# Patient Record
Sex: Female | Born: 1980 | Race: Black or African American | Hispanic: No | Marital: Single | Smoking: Former smoker
Health system: Southern US, Community
[De-identification: ages and names within clinical notes are randomized; demographics above are authoritative.]

## PROBLEM LIST (undated history)

## (undated) DIAGNOSIS — G8929 Other chronic pain: Secondary | ICD-10-CM

## (undated) DIAGNOSIS — F329 Major depressive disorder, single episode, unspecified: Secondary | ICD-10-CM

## (undated) DIAGNOSIS — I10 Essential (primary) hypertension: Secondary | ICD-10-CM

## (undated) DIAGNOSIS — Z5189 Encounter for other specified aftercare: Secondary | ICD-10-CM

## (undated) DIAGNOSIS — E119 Type 2 diabetes mellitus without complications: Secondary | ICD-10-CM

## (undated) DIAGNOSIS — C801 Malignant (primary) neoplasm, unspecified: Secondary | ICD-10-CM

## (undated) DIAGNOSIS — I509 Heart failure, unspecified: Secondary | ICD-10-CM

## (undated) DIAGNOSIS — F419 Anxiety disorder, unspecified: Secondary | ICD-10-CM

## (undated) DIAGNOSIS — E785 Hyperlipidemia, unspecified: Secondary | ICD-10-CM

## (undated) HISTORY — PX: TRACHEOSTOMY: SUR1362

---

## 2015-08-03 ENCOUNTER — Emergency Department: Payer: Medicaid Other

## 2015-08-03 ENCOUNTER — Encounter: Payer: Self-pay | Admitting: Internal Medicine

## 2015-08-03 ENCOUNTER — Observation Stay
Admission: EM | Admit: 2015-08-03 | Discharge: 2015-08-04 | Disposition: A | Discharge status: Skilled Nursing Facility | Payer: Medicaid Other | Attending: Internal Medicine | Admitting: Internal Medicine

## 2015-08-03 DIAGNOSIS — E119 Type 2 diabetes mellitus without complications: Secondary | ICD-10-CM

## 2015-08-03 DIAGNOSIS — R778 Other specified abnormalities of plasma proteins: Secondary | ICD-10-CM | POA: Diagnosis present

## 2015-08-03 DIAGNOSIS — M4134 Thoracogenic scoliosis, thoracic region: Secondary | ICD-10-CM | POA: Diagnosis not present

## 2015-08-03 DIAGNOSIS — Z886 Allergy status to analgesic agent status: Secondary | ICD-10-CM | POA: Diagnosis not present

## 2015-08-03 DIAGNOSIS — I5022 Chronic systolic (congestive) heart failure: Secondary | ICD-10-CM | POA: Diagnosis not present

## 2015-08-03 DIAGNOSIS — R51 Headache: Secondary | ICD-10-CM | POA: Diagnosis not present

## 2015-08-03 DIAGNOSIS — Z930 Tracheostomy status: Secondary | ICD-10-CM | POA: Diagnosis not present

## 2015-08-03 DIAGNOSIS — Z79899 Other long term (current) drug therapy: Secondary | ICD-10-CM | POA: Insufficient documentation

## 2015-08-03 DIAGNOSIS — F419 Anxiety disorder, unspecified: Secondary | ICD-10-CM | POA: Insufficient documentation

## 2015-08-03 DIAGNOSIS — R0989 Other specified symptoms and signs involving the circulatory and respiratory systems: Secondary | ICD-10-CM | POA: Diagnosis not present

## 2015-08-03 DIAGNOSIS — N920 Excessive and frequent menstruation with regular cycle: Secondary | ICD-10-CM | POA: Diagnosis not present

## 2015-08-03 DIAGNOSIS — R5383 Other fatigue: Secondary | ICD-10-CM | POA: Diagnosis not present

## 2015-08-03 DIAGNOSIS — R0602 Shortness of breath: Secondary | ICD-10-CM | POA: Insufficient documentation

## 2015-08-03 DIAGNOSIS — Z6841 Body Mass Index (BMI) 40.0 and over, adult: Secondary | ICD-10-CM | POA: Insufficient documentation

## 2015-08-03 DIAGNOSIS — Z794 Long term (current) use of insulin: Secondary | ICD-10-CM | POA: Insufficient documentation

## 2015-08-03 DIAGNOSIS — E785 Hyperlipidemia, unspecified: Secondary | ICD-10-CM | POA: Diagnosis present

## 2015-08-03 DIAGNOSIS — D649 Anemia, unspecified: Secondary | ICD-10-CM | POA: Diagnosis not present

## 2015-08-03 DIAGNOSIS — R05 Cough: Secondary | ICD-10-CM | POA: Insufficient documentation

## 2015-08-03 DIAGNOSIS — E662 Morbid (severe) obesity with alveolar hypoventilation: Secondary | ICD-10-CM | POA: Insufficient documentation

## 2015-08-03 DIAGNOSIS — I10 Essential (primary) hypertension: Secondary | ICD-10-CM | POA: Diagnosis present

## 2015-08-03 DIAGNOSIS — R197 Diarrhea, unspecified: Secondary | ICD-10-CM | POA: Diagnosis present

## 2015-08-03 DIAGNOSIS — G8929 Other chronic pain: Secondary | ICD-10-CM | POA: Insufficient documentation

## 2015-08-03 DIAGNOSIS — F329 Major depressive disorder, single episode, unspecified: Secondary | ICD-10-CM | POA: Diagnosis not present

## 2015-08-03 DIAGNOSIS — R7989 Other specified abnormal findings of blood chemistry: Secondary | ICD-10-CM | POA: Diagnosis present

## 2015-08-03 HISTORY — DX: Other chronic pain: G89.29

## 2015-08-03 HISTORY — DX: Essential (primary) hypertension: I10

## 2015-08-03 HISTORY — DX: Anxiety disorder, unspecified: F41.9

## 2015-08-03 HISTORY — DX: Type 2 diabetes mellitus without complications: E11.9

## 2015-08-03 HISTORY — DX: Heart failure, unspecified: I50.9

## 2015-08-03 HISTORY — DX: Major depressive disorder, single episode, unspecified: F32.9

## 2015-08-03 HISTORY — DX: Hyperlipidemia, unspecified: E78.5

## 2015-08-03 LAB — GLUCOSE, CAPILLARY: GLUCOSE-CAPILLARY: 103 mg/dL — AB (ref 65–99)

## 2015-08-03 LAB — CBC WITH DIFFERENTIAL/PLATELET
BASOS PCT: 1 %
Basophils Absolute: 0.1 10*3/uL (ref 0–0.1)
Eosinophils Absolute: 0.4 10*3/uL (ref 0–0.7)
Eosinophils Relative: 3 %
HEMATOCRIT: 25.7 % — AB (ref 35.0–47.0)
HEMOGLOBIN: 7.9 g/dL — AB (ref 12.0–16.0)
LYMPHS ABS: 2.3 10*3/uL (ref 1.0–3.6)
Lymphocytes Relative: 15 %
MCH: 26.5 pg (ref 26.0–34.0)
MCHC: 30.9 g/dL — AB (ref 32.0–36.0)
MCV: 85.7 fL (ref 80.0–100.0)
MONOS PCT: 6 %
Monocytes Absolute: 1 10*3/uL — ABNORMAL HIGH (ref 0.2–0.9)
NEUTROS ABS: 11.5 10*3/uL — AB (ref 1.4–6.5)
NEUTROS PCT: 75 %
Platelets: 410 10*3/uL (ref 150–440)
RBC: 2.99 MIL/uL — AB (ref 3.80–5.20)
RDW: 18.6 % — ABNORMAL HIGH (ref 11.5–14.5)
WBC: 15.4 10*3/uL — AB (ref 3.6–11.0)

## 2015-08-03 LAB — URINALYSIS COMPLETE WITH MICROSCOPIC (ARMC ONLY)
Bacteria, UA: NONE SEEN
Bilirubin Urine: NEGATIVE
GLUCOSE, UA: NEGATIVE mg/dL
Hgb urine dipstick: NEGATIVE
KETONES UR: NEGATIVE mg/dL
Leukocytes, UA: NEGATIVE
NITRITE: NEGATIVE
Protein, ur: NEGATIVE mg/dL
RBC / HPF: NONE SEEN RBC/hpf (ref 0–5)
SPECIFIC GRAVITY, URINE: 1.009 (ref 1.005–1.030)
pH: 5 (ref 5.0–8.0)

## 2015-08-03 LAB — CBC
HEMATOCRIT: 26.2 % — AB (ref 35.0–47.0)
HEMOGLOBIN: 8.1 g/dL — AB (ref 12.0–16.0)
MCH: 25.9 pg — ABNORMAL LOW (ref 26.0–34.0)
MCHC: 30.8 g/dL — ABNORMAL LOW (ref 32.0–36.0)
MCV: 84.1 fL (ref 80.0–100.0)
Platelets: 469 10*3/uL — ABNORMAL HIGH (ref 150–440)
RBC: 3.11 MIL/uL — ABNORMAL LOW (ref 3.80–5.20)
RDW: 18.3 % — ABNORMAL HIGH (ref 11.5–14.5)
WBC: 14.9 10*3/uL — AB (ref 3.6–11.0)

## 2015-08-03 LAB — BASIC METABOLIC PANEL
ANION GAP: 5 (ref 5–15)
BUN: 19 mg/dL (ref 6–20)
CO2: 32 mmol/L (ref 22–32)
Calcium: 9.3 mg/dL (ref 8.9–10.3)
Chloride: 101 mmol/L (ref 101–111)
Creatinine, Ser: 0.93 mg/dL (ref 0.44–1.00)
GFR calc Af Amer: >60 mL/min (ref >60)
Glucose, Bld: 104 mg/dL — ABNORMAL HIGH (ref 65–99)
POTASSIUM: 4.1 mmol/L (ref 3.5–5.1)
Sodium: 138 mmol/L (ref 135–145)

## 2015-08-03 LAB — TROPONIN I
TROPONIN I: 0.04 ng/mL — AB (ref <0.031)
Troponin I: 0.04 ng/mL — ABNORMAL HIGH (ref <0.031)

## 2015-08-03 LAB — POCT RAPID STREP A: STREPTOCOCCUS, GROUP A SCREEN (DIRECT): NEGATIVE

## 2015-08-03 MED ORDER — HYDROMORPHONE HCL 1 MG/ML IJ SOLN
INTRAMUSCULAR | Status: AC
  Administered 2015-08-03: 1 mg via INTRAVENOUS
  Filled 2015-08-03: qty 1

## 2015-08-03 MED ORDER — SODIUM CHLORIDE 0.9 % IV SOLN: 10.0000 mL/h | Freq: Once | INTRAVENOUS | Status: AC

## 2015-08-03 MED ORDER — HYDROMORPHONE HCL 1 MG/ML IJ SOLN
1.0000 mg | Freq: Once | INTRAMUSCULAR | Status: AC
  Administered 2015-08-03: 1 mg via INTRAVENOUS

## 2015-08-03 MED ORDER — ACETAMINOPHEN 325 MG PO TABS
650.0000 mg | ORAL_TABLET | Freq: Once | ORAL | Status: AC
  Administered 2015-08-03: 650 mg via ORAL
  Filled 2015-08-03: qty 2

## 2015-08-03 MED ORDER — MEDROXYPROGESTERONE ACETATE 10 MG PO TABS
10.0000 mg | ORAL_TABLET | Freq: Once | ORAL | Status: DC
  Filled 2015-08-03: qty 1

## 2015-08-03 MED ORDER — FUROSEMIDE 10 MG/ML IJ SOLN
20.0000 mg | Freq: Once | INTRAMUSCULAR | Status: AC
  Administered 2015-08-03: 20 mg via INTRAVENOUS
  Filled 2015-08-03: qty 4

## 2015-08-03 NOTE — ED Provider Notes (Signed)
[facility] Emergency Department Provider Note  ____________________________________________  Time seen: Approximately 315 PM  I have reviewed the triage vital signs and the nursing notes.   HISTORY  Chief Complaint Fatigue and Diarrhea    HPI Sherry Ramsey is a 35 y.o. female with morbid obesity, CHF and respiratory failure who is presenting to the emergency department today with weakness, diarrhea and nausea over the past week. She says she is just changed nursing homes 2 weeks ago and has one week ago started to develop these symptoms. She does not report any blood in the stool. Denies any recent antibiotics. Says that she has had some recent burning with urination but no frequency. She is also reporting a metallic taste in her mouth as well as a diffuse headache which is worse frontally. She says that she has chronic headaches which often a 10 out of 10.  She says this headache is similar to previous. She says that she has had about 6 episodes of diarrhea both today and yesterday. She says that she was just taken off senna yesterday because of her diarrhea. She is also reporting a cough which is intermittently productive and chronic. Denies any runny nose.   No past medical history on file.  There are no active problems to display for this patient.   No past surgical history on file.  Current Outpatient Rx  Name  Route  Sig  Dispense  Refill  . atorvastatin (LIPITOR) 10 MG tablet   Oral   Take 10 mg by mouth at bedtime.         . butalbital-acetaminophen-caffeine (FIORICET, ESGIC) 50-325-40 MG tablet   Oral   Take 1 tablet by mouth every 4 (four) hours as needed for headache.         . carvedilol (COREG) 6.25 MG tablet   Oral   Take 6.25 mg by mouth 2 (two) times daily with a meal.         . clonazePAM (KLONOPIN) 0.5 MG tablet   Oral   Take 1 mg by mouth at bedtime.         . diphenhydrAMINE (BENADRYL) 25 MG tablet   Oral   Take 25 mg by  mouth every 6 (six) hours as needed for itching.         . furosemide (LASIX) 40 MG tablet   Oral   Take 40 mg by mouth.         Marland Kitchen guaiFENesin (MUCINEX) 600 MG 12 hr tablet   Oral   Take 600 mg by mouth 2 (two) times daily as needed for cough.         Marland Kitchen HYDROmorphone (DILAUDID) 2 MG tablet   Oral   Take 2 mg by mouth every 4 (four) hours as needed for severe pain.         Marland Kitchen lisinopril (PRINIVIL,ZESTRIL) 5 MG tablet   Oral   Take 5 mg by mouth daily.         . medroxyPROGESTERone (PROVERA) 10 MG tablet   Oral   Take 10 mg by mouth daily.         . Melatonin 5 MG TABS   Oral   Take 10 mg by mouth at bedtime.         . Multiple Vitamin (MULTIVITAMIN WITH MINERALS) TABS tablet   Oral   Take 1 tablet by mouth daily.         Marland Kitchen senna (SENOKOT) 8.6 MG tablet   Oral  Take 2 tablets by mouth 2 (two) times daily.         . sertraline (ZOLOFT) 50 MG tablet   Oral   Take 50 mg by mouth daily.         Marland Kitchen topiramate (TOPAMAX) 25 MG tablet   Oral   Take 25 mg by mouth 2 (two) times daily.         . traZODone (DESYREL) 50 MG tablet   Oral   Take 200 mg by mouth at bedtime.           Allergies Ibuprofen  No family history on file.  Social History Social History  Substance Use Topics  . Smoking status: Not on file  . Smokeless tobacco: Not on file  . Alcohol Use: Not on file    Review of Systems Constitutional: No fever/chills Eyes: No visual changes. ENT: Admits to mild sore throat Cardiovascular: Denies chest pain. Respiratory: As above Gastrointestinal: No abdominal pain.   no vomiting.  No constipation. Genitourinary: Negative for dysuria. Musculoskeletal: Chronic low back pain Skin: Negative for rash. Neurological: Negative for focal weakness or numbness.  10-point ROS otherwise negative.  ____________________________________________   PHYSICAL EXAM:  VITAL SIGNS: ED Triage Vitals  Enc Vitals Group     BP 08/03/15 1450  144/83 mmHg     Pulse Rate 08/03/15 1450 96     Resp 08/03/15 1450 24     Temp 08/03/15 1450 98.7 F (37.1 C)     Temp Source 08/03/15 1450 Oral     SpO2 08/03/15 1450 97 %     Weight 08/03/15 1450 550 lb (249.478 kg)     Height --      Head Cir --      Peak Flow --      Pain Score 08/03/15 1450 10     Pain Loc --      Pain Edu? --      Excl. in [place]? --     Constitutional: Alert and oriented. Well appearing and in no acute distress. Morbidly obese. Eyes: Conjunctivae are normal. PERRL. EOMI. Head: Atraumatic. Mild tenderness to palpation over the frontal and maxillary sinuses. Nose: No congestion/rhinnorhea. Mouth/Throat: Mucous membranes are moist.  Mild erythema to the posterior pharynx. Mild tonsillar swelling without exudate. Neck: Trach in place.  Cardiovascular: Normal rate, regular rhythm. Grossly normal heart sounds.   Respiratory: Normal respiratory effort.  No retractions. Lungs CTAB. Gastrointestinal: Soft and nontender. No distention. No abdominal bruits. No CVA tenderness. Musculoskeletal: Bilateral lower extremity swelling with moderate edema. The patient says that this is chronic and unchanged. She also has pain in her left lower extremity which is also chronic. Neurologic:  Normal speech and language. No gross focal neurologic deficits are appreciated.  Skin:  Skin is warm, dry and intact. No rash noted. Psychiatric: Mood and affect are normal. Speech and behavior are normal.  ____________________________________________   LABS (all labs ordered are listed, but only abnormal results are displayed)  Labs Reviewed  BASIC METABOLIC PANEL - Abnormal; Notable for the following:    Glucose, Bld 104 (*)    All other components within normal limits  CBC - Abnormal; Notable for the following:    WBC 14.9 (*)    RBC 3.11 (*)    Hemoglobin 8.1 (*)    HCT 26.2 (*)    MCH 25.9 (*)    MCHC 30.8 (*)    RDW 18.3 (*)    Platelets 469 (*)    All  other components within  normal limits  URINALYSIS COMPLETEWITH MICROSCOPIC ([facility] ONLY) - Abnormal; Notable for the following:    Color, Urine YELLOW (*)    APPearance CLEAR (*)    Squamous Epithelial / LPF 0-5 (*)    All other components within normal limits  GLUCOSE, CAPILLARY - Abnormal; Notable for the following:    Glucose-Capillary 103 (*)    All other components within normal limits  TROPONIN I - Abnormal; Notable for the following:    Troponin I 0.04 (*)    All other components within normal limits  CBC WITH DIFFERENTIAL/PLATELET - Abnormal; Notable for the following:    WBC 15.4 (*)    RBC 2.99 (*)    Hemoglobin 7.9 (*)    HCT 25.7 (*)    MCHC 30.9 (*)    RDW 18.6 (*)    Neutro Abs 11.5 (*)    Monocytes Absolute 1.0 (*)    All other components within normal limits  TROPONIN I - Abnormal; Notable for the following:    Troponin I 0.04 (*)    All other components within normal limits  CBG MONITORING, ED  POCT RAPID STREP A  PREPARE RBC (CROSSMATCH)   ____________________________________________  EKG  ED ECG REPORT I, Doran Stabler, the attending physician, personally viewed and interpreted this ECG.   Date: 08/03/2015  EKG Time: 1452  Rate: 91  Rhythm: normal sinus rhythm  Axis: Normal  Intervals:none  ST&T Change: No ST segment elevation or depression. No abnormal T-wave inversion.  ____________________________________________  RADIOLOGY  IMPRESSION: 1. Cardiac enlargement and pulmonary vascular congestion.   Electronically Signed By: Kerby Moors M.D. On: 08/03/2015 16:11  ____________________________________________   PROCEDURES    ____________________________________________   INITIAL IMPRESSION / ASSESSMENT AND PLAN / ED COURSE  Pertinent labs & imaging results that were available during my care of the patient were reviewed by me and considered in my medical decision making (see chart for details).  ----------------------------------------- 10:31  PM on 08/03/2015 -----------------------------------------  Discussed patient's previous lab results with her nurse at her current skilled nursing facility and she had a hemoglobin of 10 on 07/19/2015. The patient has had significant bleeding vaginally since then and was recently seen by OB/GYN was placed on medroxyprogesterone 10 mg daily. I discussed case with Dr. Leonides Schanz including the drop in her hemoglobin and she recommended that the medroxyprogesterone be increased to twice a day, 10 mg. I will be transfusing the patient one unit of blood because of her he will been dry. She did have a small amount of blood on her sheets here but no mass amount of vaginal bleeding. I discussed the case with the patient as well as her lab results and the need for admission. Signed out to Dr. Jannifer Franklin. Patient has not had any further episodes of diarrhea. Unlikely to be C. difficile. ____________________________________________   FINAL CLINICAL IMPRESSION(S) / ED DIAGNOSES  Symptomatic anemia. Vaginal bleeding. Diarrhea.     Orbie Pyo, MD 08/03/15 [phone]

## 2015-08-03 NOTE — ED Notes (Signed)
Pt arrives to ER via [facility] from [facility]. Pt states that she went to OBGYN earlier in the week; has not felt well since. C/o fatigue and diarrhea. Pt recently moved to [facility]. Pt tearful, c/o headache. Pt arrives on 3L [place].

## 2015-08-03 NOTE — H&P (Signed)
[place] [facility] [place] NAME: Sherry Ramsey    MR#:  [id number]  DATE OF BIRTH:  Jun 29, 1980  DATE OF ADMISSION:  08/03/2015  PRIMARY CARE PHYSICIAN: No primary care provider on file.   REQUESTING/REFERRING PHYSICIAN: Clearnce Hasten, MD  CHIEF COMPLAINT:   Chief Complaint  Patient presents with  . Fatigue  . Diarrhea    HISTORY OF PRESENT ILLNESS:  Sherry Ramsey  is a 35 y.o. female who presents with progressive fatigue and headache. Patient has many comorbid medical problems, and recently has been having significant vaginal bleeding. She was seen by obstetrics and was put on oral contraceptive to help with her menorrhagia. She states that this did help with her menorrhagia, which is currently resolved. However, she's had progressive fatigue and headache recently. On evaluation in the ED today her hemoglobin was found to be 7.9. She has significant history of heart failure, and had some vascular congestion on her chest x-ray today. She was ordered a unit of blood for transfusion for symptom medical anemia, and hospitalists were called for admission.  PAST MEDICAL HISTORY:   Past Medical History  Diagnosis Date  . Diabetes ([place])   . HTN (hypertension)   . HLD (hyperlipidemia)   . Chronic pain   . CHF (congestive heart failure) ([place])   . MDD (major depressive disorder) ([place])   . Anxiety     PAST SURGICAL HISTORY:   Past Surgical History  Procedure Laterality Date  . Tracheostomy      SOCIAL HISTORY:   Social History  Substance Use Topics  . Smoking status: Never Smoker   . Smokeless tobacco: Not on file  . Alcohol Use: No    FAMILY HISTORY:   Family History  Problem Relation Age of Onset  . Diabetes Neg Hx   . Heart failure Neg Hx     DRUG ALLERGIES:   Allergies  Allergen Reactions  . Ibuprofen Other (See Comments)    Reaction: unknown    MEDICATIONS AT HOME:   Prior to Admission medications   Medication Sig Start  Date End Date Taking? Authorizing Provider  atorvastatin (LIPITOR) 10 MG tablet Take 10 mg by mouth at bedtime.   Yes Historical Provider, MD  butalbital-acetaminophen-caffeine (FIORICET, ESGIC) 50-325-40 MG tablet Take 1 tablet by mouth every 4 (four) hours as needed for headache.   Yes Historical Provider, MD  carvedilol (COREG) 6.25 MG tablet Take 6.25 mg by mouth 2 (two) times daily with a meal.   Yes Historical Provider, MD  clonazePAM (KLONOPIN) 0.5 MG tablet Take 1 mg by mouth at bedtime.   Yes Historical Provider, MD  diphenhydrAMINE (BENADRYL) 25 MG tablet Take 25 mg by mouth every 6 (six) hours as needed for itching.   Yes Historical Provider, MD  furosemide (LASIX) 40 MG tablet Take 40 mg by mouth.   Yes Historical Provider, MD  guaiFENesin (MUCINEX) 600 MG 12 hr tablet Take 600 mg by mouth 2 (two) times daily as needed for cough.   Yes Historical Provider, MD  HYDROmorphone (DILAUDID) 2 MG tablet Take 2 mg by mouth every 4 (four) hours as needed for severe pain. 07/25/15 08/15/15 Yes Historical Provider, MD  lisinopril (PRINIVIL,ZESTRIL) 5 MG tablet Take 5 mg by mouth daily.   Yes Historical Provider, MD  medroxyPROGESTERone (PROVERA) 10 MG tablet Take 10 mg by mouth daily.   Yes Historical Provider, MD  Melatonin 5 MG TABS Take 10 mg by mouth at bedtime.  Yes Historical Provider, MD  Multiple Vitamin (MULTIVITAMIN WITH MINERALS) TABS tablet Take 1 tablet by mouth daily.   Yes Historical Provider, MD  senna (SENOKOT) 8.6 MG tablet Take 2 tablets by mouth 2 (two) times daily.   Yes Historical Provider, MD  sertraline (ZOLOFT) 50 MG tablet Take 50 mg by mouth daily. 07/29/15 08/04/15 Yes Historical Provider, MD  topiramate (TOPAMAX) 25 MG tablet Take 25 mg by mouth 2 (two) times daily.   Yes Historical Provider, MD  traZODone (DESYREL) 50 MG tablet Take 200 mg by mouth at bedtime.   Yes Historical Provider, MD    REVIEW OF SYSTEMS:  Review of Systems  Constitutional: Positive for  malaise/fatigue. Negative for fever, chills and weight loss.  HENT: Negative for ear pain, hearing loss and tinnitus.   Eyes: Negative for blurred vision, double vision, pain and redness.  Respiratory: Negative for cough, hemoptysis and shortness of breath.   Cardiovascular: Negative for chest pain, palpitations, orthopnea and leg swelling.  Gastrointestinal: Negative for nausea, vomiting, abdominal pain, diarrhea and constipation.  Genitourinary: Negative for dysuria, frequency and hematuria.  Musculoskeletal: Negative for back pain, joint pain and neck pain.  Skin:       No acne, rash, or lesions  Neurological: Positive for headaches. Negative for dizziness, tremors, focal weakness and weakness.  Endo/Heme/Allergies: Negative for polydipsia. Does not bruise/bleed easily.  Psychiatric/Behavioral: Negative for depression. The patient is not nervous/anxious and does not have insomnia.      VITAL SIGNS:   Filed Vitals:   08/03/15 1645 08/03/15 1700 08/03/15 1730 08/03/15 1934  BP:  106/57 128/74 129/85  Pulse: 94  97 90  Temp:      TempSrc:      Resp: 18 20 25 21   Weight:      SpO2: 100%  99% 100%   Wt Readings from Last 3 Encounters:  08/03/15 249.478 kg (550 lb)    PHYSICAL EXAMINATION:  Physical Exam  Vitals reviewed. Constitutional: She is oriented to person, place, and time. She appears well-developed and well-nourished. No distress.  HENT:  Head: Normocephalic and atraumatic.  Mouth/Throat: Oropharynx is clear and moist.  Eyes: Conjunctivae and EOM are normal. Pupils are equal, round, and reactive to light. No scleral icterus.  Neck: Normal range of motion. Neck supple. No JVD present. No thyromegaly present.  Cardiovascular: Normal rate, regular rhythm and intact distal pulses.  Exam reveals no gallop and no friction rub.   No murmur heard. Respiratory: Effort normal and breath sounds normal. No respiratory distress. She has no wheezes. She has no rales.  GI: Soft.  Bowel sounds are normal. She exhibits no distension. There is no tenderness.  Morbidly obese  Musculoskeletal: Normal range of motion. She exhibits no edema.  No arthritis, no gout  Lymphadenopathy:    She has no cervical adenopathy.  Neurological: She is alert and oriented to person, place, and time. No cranial nerve deficit.  No dysarthria, no aphasia  Skin: Skin is warm and dry. No rash noted. No erythema.  Psychiatric: She has a normal mood and affect. Her behavior is normal. Judgment and thought content normal.    LABORATORY PANEL:   CBC  Recent Labs Lab 08/03/15 2024  WBC 15.4*  HGB 7.9*  HCT 25.7*  PLT 410   ------------------------------------------------------------------------------------------------------------------  Chemistries   Recent Labs Lab 08/03/15 1628  NA 138  K 4.1  CL 101  CO2 32  GLUCOSE 104*  BUN 19  CREATININE 0.93  CALCIUM 9.3   ------------------------------------------------------------------------------------------------------------------  Cardiac Enzymes  Recent Labs Lab 08/03/15 2024  TROPONINI 0.04*   ------------------------------------------------------------------------------------------------------------------  RADIOLOGY:  Dg Chest 2 View  08/03/2015  CLINICAL DATA:  Shortness of breath. EXAM: CHEST  2 VIEW COMPARISON:  None. FINDINGS: The heart size is moderately enlarged. There is a tracheostomy tube with tip above the carina. Pulmonary vascular congestion is noted. No airspace consolidation. No pleural effusion. Scoliosis deformity involves the thoracic spine. IMPRESSION: 1. Cardiac enlargement and pulmonary vascular congestion. Electronically Signed   By: Kerby Moors M.D.   On: 08/03/2015 16:11    EKG:   Orders placed or performed during the hospital encounter of 08/03/15  . ED EKG  . ED EKG    IMPRESSION AND PLAN:  Principal Problem:   Symptomatic anemia - one unit of packed red blood cells ordered to be  transfused by ED physician, we will admit her for observation and expected improvement. Recheck H&H posttransfusion. Active Problems:   Elevated troponin - very mildly elevated, very likely either her baseline or very mild demand ischemia. However, we will trend serially tonight. No complaint of chest pain.   Type 2 diabetes mellitus (HCC) - sliding scale insulin with corresponding glucose checks and carb modified diet   HTN (hypertension) - continue home meds, currently stable   Chronic systolic CHF (congestive heart failure) ([place]) - she is given a dose of IV Lasix per transfusion, we'll continue her home dose diuretic and monitor closely, can use further IV diuretics if necessary.   HLD (hyperlipidemia) - continue home meds  All the records are reviewed and case discussed with ED provider. Management plans discussed with the patient and/or family.  DVT PROPHYLAXIS: SubQ lovenox  GI PROPHYLAXIS: None  ADMISSION STATUS: Observation  CODE STATUS: Full Code Status History    This patient does not have a recorded code status. Please follow your organizational policy for patients in this situation.      TOTAL TIME TAKING CARE OF THIS PATIENT: 40 minutes.    Hye Trawick Ford 08/03/2015, 10:55 PM  [facility]  Office  [phone]  CC: Primary care physician; No primary care provider on file.

## 2015-08-04 ENCOUNTER — Observation Stay
Admit: 2015-08-04 | Discharge: 2015-08-04 | Disposition: A | Discharge status: Home / Self Care | Payer: Medicaid Other | Attending: Internal Medicine | Admitting: Internal Medicine

## 2015-08-04 LAB — GLUCOSE, CAPILLARY
GLUCOSE-CAPILLARY: 107 mg/dL — AB (ref 65–99)
Glucose-Capillary: 104 mg/dL — ABNORMAL HIGH (ref 65–99)
Glucose-Capillary: 113 mg/dL — ABNORMAL HIGH (ref 65–99)
Glucose-Capillary: 128 mg/dL — ABNORMAL HIGH (ref 65–99)

## 2015-08-04 LAB — BASIC METABOLIC PANEL
Anion gap: 4 — ABNORMAL LOW (ref 5–15)
BUN: 18 mg/dL (ref 6–20)
CO2: 33 mmol/L — ABNORMAL HIGH (ref 22–32)
CREATININE: 0.89 mg/dL (ref 0.44–1.00)
Calcium: 8.7 mg/dL — ABNORMAL LOW (ref 8.9–10.3)
Chloride: 102 mmol/L (ref 101–111)
GFR calc Af Amer: >60 mL/min (ref >60)
GLUCOSE: 123 mg/dL — AB (ref 65–99)
POTASSIUM: 3.8 mmol/L (ref 3.5–5.1)
SODIUM: 139 mmol/L (ref 135–145)

## 2015-08-04 LAB — PREPARE RBC (CROSSMATCH)

## 2015-08-04 LAB — CBC
HEMATOCRIT: 27.7 % — AB (ref 35.0–47.0)
Hemoglobin: 8.7 g/dL — ABNORMAL LOW (ref 12.0–16.0)
MCH: 26.5 pg (ref 26.0–34.0)
MCHC: 31.2 g/dL — AB (ref 32.0–36.0)
MCV: 84.9 fL (ref 80.0–100.0)
PLATELETS: 450 10*3/uL — AB (ref 150–440)
RBC: 3.26 MIL/uL — ABNORMAL LOW (ref 3.80–5.20)
RDW: 18.5 % — AB (ref 11.5–14.5)
WBC: 14.2 10*3/uL — AB (ref 3.6–11.0)

## 2015-08-04 LAB — TROPONIN I
TROPONIN I: 0.1 ng/mL — AB (ref <0.031)
TROPONIN I: 0.04 ng/mL — AB (ref <0.031)
TROPONIN I: 0.05 ng/mL — AB (ref <0.031)

## 2015-08-04 LAB — ECHOCARDIOGRAM COMPLETE: Weight: 7289.6 oz

## 2015-08-04 LAB — BRAIN NATRIURETIC PEPTIDE: B NATRIURETIC PEPTIDE 5: 230 pg/mL — AB (ref 0.0–100.0)

## 2015-08-04 LAB — MRSA PCR SCREENING: MRSA by PCR: POSITIVE — AB

## 2015-08-04 LAB — ABO/RH: ABO/RH(D): O POS

## 2015-08-04 LAB — HEMOGLOBIN A1C: HEMOGLOBIN A1C: 5.6 % (ref 4.0–6.0)

## 2015-08-04 MED ORDER — LISINOPRIL 5 MG PO TABS
5.0000 mg | ORAL_TABLET | Freq: Every day | ORAL | Status: DC
  Administered 2015-08-04: 5 mg via ORAL
  Filled 2015-08-04: qty 1

## 2015-08-04 MED ORDER — ONDANSETRON HCL 4 MG PO TABS: 4.0000 mg | ORAL_TABLET | Freq: Four times a day (QID) | ORAL | Status: DC | PRN

## 2015-08-04 MED ORDER — CLONAZEPAM 1 MG PO TABS: 1.0000 mg | ORAL_TABLET | Freq: Every day | ORAL | Status: DC

## 2015-08-04 MED ORDER — ACETAMINOPHEN 650 MG RE SUPP: 650.0000 mg | Freq: Four times a day (QID) | RECTAL | Status: DC | PRN

## 2015-08-04 MED ORDER — ONDANSETRON HCL 4 MG/2ML IJ SOLN: 4.0000 mg | Freq: Four times a day (QID) | INTRAMUSCULAR | Status: DC | PRN

## 2015-08-04 MED ORDER — ATORVASTATIN CALCIUM 10 MG PO TABS
10.0000 mg | ORAL_TABLET | Freq: Every day | ORAL | Status: DC
  Filled 2015-08-04: qty 1

## 2015-08-04 MED ORDER — ENOXAPARIN SODIUM 40 MG/0.4ML ~~LOC~~ SOLN: 40.0000 mg | Freq: Two times a day (BID) | SUBCUTANEOUS | Status: DC

## 2015-08-04 MED ORDER — INSULIN ASPART 100 UNIT/ML ~~LOC~~ SOLN: 0.0000 [IU] | Freq: Every day | SUBCUTANEOUS | Status: DC

## 2015-08-04 MED ORDER — LORAZEPAM 2 MG/ML IJ SOLN
INTRAMUSCULAR | Status: AC
  Administered 2015-08-04: 1 mg
  Filled 2015-08-04: qty 1

## 2015-08-04 MED ORDER — CARVEDILOL 6.25 MG PO TABS
6.2500 mg | ORAL_TABLET | Freq: Two times a day (BID) | ORAL | Status: DC
  Administered 2015-08-04: 6.25 mg via ORAL
  Filled 2015-08-04: qty 1

## 2015-08-04 MED ORDER — SODIUM CHLORIDE 0.9 % IV SOLN
Freq: Once | INTRAVENOUS | Status: AC
  Administered 2015-08-04: 02:00:00 via INTRAVENOUS

## 2015-08-04 MED ORDER — TOPIRAMATE 25 MG PO TABS
25.0000 mg | ORAL_TABLET | Freq: Two times a day (BID) | ORAL | Status: DC
  Administered 2015-08-04: 25 mg via ORAL
  Filled 2015-08-04 (×3): qty 1

## 2015-08-04 MED ORDER — ACETAMINOPHEN 325 MG PO TABS
650.0000 mg | ORAL_TABLET | Freq: Four times a day (QID) | ORAL | Status: DC | PRN
  Administered 2015-08-04: 650 mg via ORAL
  Filled 2015-08-04: qty 2

## 2015-08-04 MED ORDER — MUPIROCIN 2 % EX OINT
1.0000 "application " | TOPICAL_OINTMENT | Freq: Two times a day (BID) | CUTANEOUS | Status: DC
  Filled 2015-08-04: qty 22

## 2015-08-04 MED ORDER — SODIUM CHLORIDE 0.9% FLUSH
3.0000 mL | Freq: Two times a day (BID) | INTRAVENOUS | Status: DC
  Administered 2015-08-04: 3 mL via INTRAVENOUS

## 2015-08-04 MED ORDER — LORAZEPAM 2 MG/ML IJ SOLN: 1.0000 mg | Freq: Once | INTRAMUSCULAR | Status: DC

## 2015-08-04 MED ORDER — TRAZODONE HCL 50 MG PO TABS
50.0000 mg | ORAL_TABLET | Freq: Every day | ORAL | Status: DC
  Filled 2015-08-04: qty 1

## 2015-08-04 MED ORDER — HYDROMORPHONE HCL 2 MG PO TABS
2.0000 mg | ORAL_TABLET | ORAL | Status: DC | PRN
  Administered 2015-08-04: 2 mg via ORAL
  Filled 2015-08-04 (×2): qty 1

## 2015-08-04 MED ORDER — FUROSEMIDE 40 MG PO TABS
40.0000 mg | ORAL_TABLET | Freq: Every day | ORAL | Status: DC
  Administered 2015-08-04: 40 mg via ORAL
  Filled 2015-08-04: qty 1

## 2015-08-04 MED ORDER — SERTRALINE HCL 50 MG PO TABS
50.0000 mg | ORAL_TABLET | Freq: Every day | ORAL | Status: DC
  Filled 2015-08-04: qty 1

## 2015-08-04 MED ORDER — CHLORHEXIDINE GLUCONATE CLOTH 2 % EX PADS: 6.0000 | MEDICATED_PAD | Freq: Every day | CUTANEOUS | Status: DC

## 2015-08-04 MED ORDER — INSULIN ASPART 100 UNIT/ML ~~LOC~~ SOLN: 0.0000 [IU] | Freq: Three times a day (TID) | SUBCUTANEOUS | Status: DC

## 2015-08-04 NOTE — Care Management Note (Addendum)
Case Management Note  Patient Details  Name: Bryson Grafe MRN: [id number] Date of Birth: 02-29-1981  Subjective/Objective:   CSW Mel Almond) updated that patient is to discharge back to [facility] today                 Action/Plan:   Expected Discharge Date:                  Expected Discharge Plan:     In-House Referral:     Discharge planning Services     Post Acute Care Choice:    Choice offered to:     DME Arranged:    DME Agency:     HH Arranged:    [place] Agency:     Status of Service:     Medicare Important Message Given:    Date Medicare IM Given:    Medicare IM give by:    Date Additional Medicare IM Given:    Additional Medicare Important Message give by:     If discussed at [place] of Stay Meetings, dates discussed:    Additional Comments:  Jolly Mango, RN 08/04/2015, 9:58 AM

## 2015-08-04 NOTE — NC FL2 (Signed)
[place] LEVEL OF CARE SCREENING TOOL     IDENTIFICATION  Patient Name: Sherry Ramsey Birthdate: 26-May-1980 Sex: female Admission Date (Current Location): 08/03/2015  [facility] and [place] Number:  Sherry Ramsey  ([id number] [facility]) Facility and Address:  [facility], [address], [place], [place] [address]      Provider Number: [id number]  Attending Physician Name and Address:  Demetrios Loll, MD  Relative Name and Phone Number:       Current Level of Care: Hospital Recommended Level of Care: [place] Prior Approval Number:    Date Approved/Denied:   PASRR Number:  ( [id number] )  Discharge Plan: SNF    Current Diagnoses: Patient Active Problem List   Diagnosis Date Noted  . Type 2 diabetes mellitus ([place]) 08/03/2015  . HTN (hypertension) 08/03/2015  . HLD (hyperlipidemia) 08/03/2015  . Chronic systolic CHF (congestive heart failure) ([place]) 08/03/2015  . Elevated troponin 08/03/2015  . Symptomatic anemia 08/03/2015    Orientation RESPIRATION BLADDER Height & Weight     Self, Time, Situation, Place  Tracheostomy, O2 (10 Liters Oxygen ) Continent Weight: (!) 455 lb 9.6 oz (206.659 kg) Height:  5\' 4"  (162.6 cm)  BEHAVIORAL SYMPTOMS/MOOD NEUROLOGICAL BOWEL NUTRITION STATUS   (none )  (none ) Continent Diet (Low Sodium. )  AMBULATORY STATUS COMMUNICATION OF NEEDS Skin   Extensive Assist Verbally Normal                       Personal Care Assistance Level of Assistance  Bathing, Feeding, Dressing Bathing Assistance: Maximum assistance Feeding assistance: Limited assistance Dressing Assistance: Maximum assistance     Functional Limitations Info  Sight, Hearing, Speech Sight Info: Adequate Hearing Info: Adequate Speech Info: Adequate    SPECIAL CARE FACTORS FREQUENCY                       Contractures      Additional Factors Info  Psychotropic, Insulin Sliding Scale       Insulin Sliding Scale Info:   (NovoLog Insulin Injections )       Current Medications (08/04/2015):  This is the current hospital active medication list Current Facility-Administered Medications  Medication Dose Route Frequency Provider Last Rate Last Dose  . acetaminophen (TYLENOL) tablet 650 mg  650 mg Oral Q6H PRN Lance Coon, MD       Or  . acetaminophen (TYLENOL) suppository 650 mg  650 mg Rectal Q6H PRN Lance Coon, MD      . atorvastatin (LIPITOR) tablet 10 mg  10 mg Oral QHS Lance Coon, MD   10 mg at 08/04/15 0100  . carvedilol (COREG) tablet 6.25 mg  6.25 mg Oral BID WC Lance Coon, MD   6.25 mg at 08/04/15 0906  . Chlorhexidine Gluconate Cloth 2 % PADS 6 each  6 each Topical Q0600 Demetrios Loll, MD   6 each at 08/04/15 0830  . clonazePAM (KLONOPIN) tablet 1 mg  1 mg Oral QHS Lance Coon, MD   1 mg at 08/04/15 0102  . enoxaparin (LOVENOX) injection 40 mg  40 mg Subcutaneous BID Lance Coon, MD   40 mg at 08/04/15 1000  . furosemide (LASIX) tablet 40 mg  40 mg Oral Daily Lance Coon, MD   40 mg at 08/04/15 0905  . HYDROmorphone (DILAUDID) tablet 2 mg  2 mg Oral Q4H PRN Lance Coon, MD      . insulin aspart (novoLOG) injection 0-5 Units  0-5  Units Subcutaneous QHS Lance Coon, MD   0 Units at 08/04/15 0100  . insulin aspart (novoLOG) injection 0-9 Units  0-9 Units Subcutaneous TID WC Lance Coon, MD   0 Units at 08/04/15 0800  . lisinopril (PRINIVIL,ZESTRIL) tablet 5 mg  5 mg Oral Daily Lance Coon, MD   5 mg at 08/04/15 0906  . LORazepam (ATIVAN) injection 1 mg  1 mg Intravenous Once Harrie Foreman, MD   1 mg at 08/04/15 0330  . medroxyPROGESTERone (PROVERA) tablet 10 mg  10 mg Oral Once Orbie Pyo, MD   10 mg at 08/03/15 2230  . mupirocin ointment (BACTROBAN) 2 % 1 application  1 application Nasal BID Demetrios Loll, MD   1 application at [id number]  . ondansetron (ZOFRAN) tablet 4 mg  4 mg Oral Q6H PRN Lance Coon, MD       Or  . ondansetron [facility]) injection 4 mg  4 mg Intravenous Q6H  PRN Lance Coon, MD      . sertraline (ZOLOFT) tablet 50 mg  50 mg Oral Daily Lance Coon, MD   50 mg at 08/04/15 0906  . sodium chloride flush (NS) 0.9 % injection 3 mL  3 mL Intravenous Q12H Lance Coon, MD   3 mL at 08/04/15 0909  . topiramate (TOPAMAX) tablet 25 mg  25 mg Oral BID Lance Coon, MD   25 mg at 08/04/15 0906  . traZODone (DESYREL) tablet 50 mg  50 mg Oral QHS Harrie Foreman, MD   50 mg at 08/04/15 0200     Discharge Medications: Please see discharge summary for a list of discharge medications.  Relevant Imaging Results:  Relevant Lab Results:   Additional Information  (SSN: [id number])  Loralyn Freshwater, LCSW

## 2015-08-04 NOTE — Significant Event (Signed)
Rapid Response Event Note  Overview: RR called for pt who pulled out her trach and refusing to let RT suction her.  Informed by RT that they tried to suction the pt, she became irritated and initially pulled out her inner cannula.  When RT re-inserted inner cannula and re-attempted to suction pt, pt became agitated and pulled out the whole trach.  RT was able to re-insert new trach and inner cannula from emergency bedside equipment.    Initial Focused Assessment:  Pt is alert and appears anxious in the bed.  Frank blood and frothy sections coming from trach.  On trach collar with o2 sats 98-99%.  Pt agitated and reaching for trach site.  Interventions:  Pt given ativan for anxiety, RT suctioned pt, and pt provided reassurance.   Event Summary: RR called at 0330; event ended at [place], Ester Hilley D

## 2015-08-04 NOTE — Consult Note (Signed)
Sherry Ramsey, Sherry Ramsey [id number] 11-08-1980 Demetrios Loll, MD  Reason for Consult: Evaluate trach site  HPI: The patient is a 35 year old Afro-American female who is morbidly obese and had a tracheostomy placed several weeks ago. She this scheduled be transferred to a ill nursing facility today. Last night pulled the trach tube out and a fresh one was placed back. Assessment straighter trach site to make sure this is okay.  Allergies:  Allergies  Allergen Reactions  . Ibuprofen Other (See Comments)    Reaction: unknown    ROS: Review of systems normal other than 12 systems except per HPI.  PMH:  Past Medical History  Diagnosis Date  . Diabetes ([place])   . HTN (hypertension)   . HLD (hyperlipidemia)   . Chronic pain   . CHF (congestive heart failure) ([place])   . MDD (major depressive disorder) ([place])   . Anxiety     FH:  Family History  Problem Relation Age of Onset  . Diabetes Neg Hx   . Heart failure Neg Hx     SH:  Social History   Social History  . Marital Status: Single    Spouse Name: N/A  . Number of Children: N/A  . Years of Education: N/A   Occupational History  . Not on file.   Social History Main Topics  . Smoking status: Never Smoker   . Smokeless tobacco: Not on file  . Alcohol Use: No  . Drug Use: No  . Sexual Activity: Not on file   Other Topics Concern  . Not on file   Social History Narrative    PSH:  Past Surgical History  Procedure Laterality Date  . Tracheostomy      Physical  Exam: Morbidly obese African [place] female. CN 2-12 grossly intact and symmetric.  Oral cavity, lips, gums, ororpharynx normal with no masses or lesions. Skin warm and dry. Nasal cavity without polyps or purulence. External nose and ears without masses or lesions. Neck supple with no masses or lesions, but extremely fat. No lymphadenopathy palpated. Thyroid normal with no masses. Trachea is midline in a good position in the neck. It is a #6 Shiley XLT proximal length tube. The  stoma site is clean.   A/P: Patient is morbidly obese needs to have the trach tube still sitting in the same position. This is the correct size for her and needs to remain in until she loses significant weight because of her obesity hypoventilation syndrome. The trach tube needs to be changed routinely about every 1-2 months to make sure that she does not get infection. The inner cannula should be changed daily. She is tolerating the speaking very well and can wear that all day long but may need to have that removed while she is sleeping.   Sherry Ramsey H 08/04/2015 1:03 PM

## 2015-08-04 NOTE — Discharge Instructions (Signed)
Heart healthy and ADA diet. °Activity as tolerated. °

## 2015-08-04 NOTE — Progress Notes (Signed)
EMS arrived and patient has now been transported. IV's and tele removed. Patient connected to ventimask on 3L, per respiratory's order.

## 2015-08-04 NOTE — Progress Notes (Signed)
*  PRELIMINARY RESULTS* Echocardiogram 2D Echocardiogram has been performed.  Sherry Ramsey 08/04/2015, 10:32 AM

## 2015-08-04 NOTE — Progress Notes (Signed)
EMS was called at 1445 and notified of transport. Informed EMS that patient is around 500lb and will need a larger stretcher. EMS arrived and had a stretcher for the weight requirement, but not one wide enough to fit the patient. EMS will be back with a different stretcher.

## 2015-08-04 NOTE — Clinical Social Work Note (Addendum)
Clinical Social Work Assessment  Patient Details  Name: Sherry Ramsey MRN: [id number] Date of Birth: 04/12/1981  Date of referral:  08/04/15               Reason for consult:  Facility Placement, Other (Comment Required) (From [place] )                Permission sought to share information with:  Chartered certified accountant granted to share information::  Yes, Verbal Permission Granted  Name::      Company secretary::   [place]   Relationship::   Long Term Care  Contact Information:     Housing/Transportation Living arrangements for the past 2 months:  [place] of Information:  Patient Patient Interpreter Needed:  None Criminal Activity/Legal Involvement Pertinent to Current Situation/Hospitalization:  No - Comment as needed Significant Relationships:  Siblings, Friend Lives with:  Facility Resident Do you feel safe going back to the place where you live?  Yes Need for family participation in patient care:  Yes (Comment)  Care giving concerns:  Patient is a long term care resident at [facility]. Patient has a Therapist, nutritional and is on 10 Liters of Oxygen.    Social Worker assessment / plan:  Holiday representative ([place]) received verbal consult from RN Case Freight forwarder that patient is from [facility] and is ready for D/C today. CSW contacted [facility] at [employer]. Per Helene Kelp patient is a long term care resident on [facility] and can return when stable. CSW met with patient alone at bedside. Patient was alert and oriented and was laying in the bed. Patient is agreeable to return to [facility] today and requested to be put on nasal cannula oxygen. CSW made RN aware of patient's request.   Patient is stable for D/C back to [facility] today. Per Helene Kelp patient can return. CSW sent D/C Summary, FL2 and D/C Packet to [place] via [place]. Patient is aware of above. CSW attempted to  call both contacts on patient's face sheet (friend Blanch Media and brother) however nobody answered and voicemails were left. Please reconsult if future social work needs arise. CSW signing off.   Blanch Media patient's friend and advocate called CSW back. CSW made Blanch Media aware of above and she is in agreement with plan.    Employment status:  Disabled (Comment on whether or not currently receiving Disability) Insurance information:  Medicaid [place] PT Recommendations:  Not assessed at this time Information / Referral to community resources:  [place] (Return to LTC at [facility] )  Patient/Family's Response to care:  Patient is agreeable for patient to return to [facility] today.   Patient/Family's Understanding of and Emotional Response to Diagnosis, Current Treatment, and Prognosis:  Patient was pleasant and thanked CSW for visit.   Emotional Assessment Appearance:  Appears stated age Attitude/Demeanor/Rapport:    Affect (typically observed):  Accepting, Adaptable, Pleasant Orientation:  Oriented to Self, Oriented to Place, Oriented to  Time, Oriented to Situation Alcohol / Substance use:  Not Applicable Psych involvement (Current and /or in the community):  No (Comment)  Discharge Needs  Concerns to be addressed:  Discharge Planning Concerns Readmission within the last 30 days:  No Current discharge risk:  None Barriers to Discharge:  No Barriers Identified   Loralyn Freshwater, LCSW 08/04/2015, 1:01 PM

## 2015-08-04 NOTE — Progress Notes (Signed)
Called by charge RN-Alisa to check to make sure proper trach supplies were at patient's bedside.  Arrived to find patient watch tv and eating a graham cracker.  Introduced myself and explained to patient that I was just checking to make sure all trach supplies was available at bedside. Patient then turned the light on and just started stating she could not breath. Patient had pmv on and was able to verbalize clearly.  I placed patient on bedside monitor. Saturation on ATC 100% HR 125. Patient very anxious.  RN at bedside. Attempted to lavage suction this patient however she would not let me go down with the catheter. She did not want any saline applied in trach.  She pulled the inner cannula out and would not allow me to put back in.  Other RTs arrived to assist with scenario. She would not allow other RTs to suction her also but insisted that she could not breath. Patient then pulls trach completely out of airway.  RT-Steve was able to put in a new trach #6 xlt cuffed deflated trach.  She continued to say she could not breath. She didn't want trach ties around her neck. She didn't want to be suctioned. Saturation remained good throughout scenario as well as heart rate.  RN did call a rapid response during this event.  Once trach was secured patient continued to give RT hard time about suctioning. Dr Marcille Blanco responded to event and explained to her why deep suctioning may be necessary even if the feeling is unpleasant. RT-Steve was able to suction patient without difficulty.  Vitals good. Trach collar in place. Emergency supplies remain in room

## 2015-08-04 NOTE — Discharge Summary (Signed)
[place] [facility] [place] NAME: Sherry Ramsey    MR#:  [id number]  DATE OF BIRTH:  March 13, 1981  DATE OF ADMISSION:  08/03/2015 ADMITTING PHYSICIAN: Lance Coon, MD  DATE OF DISCHARGE: 08/04/2015 PRIMARY CARE PHYSICIAN: No primary care provider on file.    ADMISSION DIAGNOSIS:  Symptomatic anemia [D64.9] Diarrhea, unspecified type [R19.7]   DISCHARGE DIAGNOSIS:  Symptomatic anemia  SECONDARY DIAGNOSIS:   Past Medical History  Diagnosis Date  . Diabetes ([place])   . HTN (hypertension)   . HLD (hyperlipidemia)   . Chronic pain   . CHF (congestive heart failure) ([place])   . MDD (major depressive disorder) ([place])   . Anxiety     HOSPITAL COURSE:   Symptomatic anemia - Hb was 7.9, s/p one unit of packed red blood cells transfusion. Hb up to 8.7. Symptoms has improved.   Elevated troponin - very mildly elevated, very likely either her baseline or very mild demand ischemia. Troponins trended down. No complaint of chest pain.   Type 2 diabetes mellitus (HCC) - sliding scale insulin with corresponding glucose checks and carb modified diet  HTN (hypertension) - continue home meds, currently stable  Chronic systolic CHF (congestive heart failure) ([place]) - she was given a dose of IV Lasix per transfusion, continue her home dose diuretic and monitor closely, can use further IV diuretics if necessary.  HLD (hyperlipidemia) - continue home meds  Pt pulled trach out last night. Respiratory was at bedside. New trach was inserted by respiratory. DISCHARGE CONDITIONS:   Stable, discharge to SNF today.  CONSULTS OBTAINED:  Treatment Team:  Margaretha Sheffield, MD  DRUG ALLERGIES:   Allergies  Allergen Reactions  . Ibuprofen Other (See Comments)    Reaction: unknown    DISCHARGE MEDICATIONS:   Current Discharge Medication List    CONTINUE these medications which have NOT CHANGED   Details  atorvastatin (LIPITOR) 10 MG tablet Take 10 mg by  mouth at bedtime.    butalbital-acetaminophen-caffeine (FIORICET, ESGIC) 50-325-40 MG tablet Take 1 tablet by mouth every 4 (four) hours as needed for headache.    carvedilol (COREG) 6.25 MG tablet Take 6.25 mg by mouth 2 (two) times daily with a meal.    clonazePAM (KLONOPIN) 0.5 MG tablet Take 1 mg by mouth at bedtime.    diphenhydrAMINE (BENADRYL) 25 MG tablet Take 25 mg by mouth every 6 (six) hours as needed for itching.    furosemide (LASIX) 40 MG tablet Take 40 mg by mouth.    guaiFENesin (MUCINEX) 600 MG 12 hr tablet Take 600 mg by mouth 2 (two) times daily as needed for cough.    HYDROmorphone (DILAUDID) 2 MG tablet Take 2 mg by mouth every 4 (four) hours as needed for severe pain.    lisinopril (PRINIVIL,ZESTRIL) 5 MG tablet Take 5 mg by mouth daily.    medroxyPROGESTERone (PROVERA) 10 MG tablet Take 10 mg by mouth daily.    Melatonin 5 MG TABS Take 10 mg by mouth at bedtime.    Multiple Vitamin (MULTIVITAMIN WITH MINERALS) TABS tablet Take 1 tablet by mouth daily.    senna (SENOKOT) 8.6 MG tablet Take 2 tablets by mouth 2 (two) times daily.    sertraline (ZOLOFT) 50 MG tablet Take 50 mg by mouth daily.    topiramate (TOPAMAX) 25 MG tablet Take 25 mg by mouth 2 (two) times daily.    traZODone (DESYREL) 50 MG tablet Take 200 mg by mouth at bedtime.  DISCHARGE INSTRUCTIONS:    If you experience worsening of your admission symptoms, develop shortness of breath, life threatening emergency, suicidal or homicidal thoughts you must seek medical attention immediately by calling 911 or calling your MD immediately  if symptoms less severe.  You Must read complete instructions/literature along with all the possible adverse reactions/side effects for all the Medicines you take and that have been prescribed to you. Take any new Medicines after you have completely understood and accept all the possible adverse reactions/side effects.   Please note  You were cared for by  a hospitalist during your hospital stay. If you have any questions about your discharge medications or the care you received while you were in the hospital after you are discharged, you can call the unit and asked to speak with the hospitalist on call if the hospitalist that took care of you is not available. Once you are discharged, your primary care physician will handle any further medical issues. Please note that NO REFILLS for any discharge medications will be authorized once you are discharged, as it is imperative that you return to your primary care physician (or establish a relationship with a primary care physician if you do not have one) for your aftercare needs so that they can reassess your need for medications and monitor your lab values.    Today   SUBJECTIVE   No compplaint.   VITAL SIGNS:  Blood pressure 117/61, pulse 97, temperature 98.3 F (36.8 C), temperature source Axillary, resp. rate 20, weight 206.659 kg (455 lb 9.6 oz), last menstrual period 07/27/2015, SpO2 98 %.  I/O:   Intake/Output Summary (Last 24 hours) at 08/04/15 0855 Last data filed at 08/04/15 0806  Gross per 24 hour  Intake    282 ml  Output   1700 ml  Net  -1418 ml    PHYSICAL EXAMINATION:  GENERAL:  35 y.o.-year-old patient lying in the bed with no acute distress. Morbid obese. EYES: Pupils equal, round, reactive to light and accommodation. No scleral icterus. Extraocular muscles intact.  HEENT: Head atraumatic, normocephalic. Oropharynx and nasopharynx clear. Trach collar in situ. NECK:  Supple, no jugular venous distention. No thyroid enlargement, no tenderness.  LUNGS: Normal breath sounds bilaterally, no wheezing, rales,rhonchi or crepitation. No use of accessory muscles of respiration.  CARDIOVASCULAR: S1, S2 normal. No murmurs, rubs, or gallops.  ABDOMEN: Soft, non-tender, non-distended. Bowel sounds present. No organomegaly or mass.  EXTREMITIES: No pedal edema, cyanosis, or clubbing.   NEUROLOGIC: Cranial nerves II through XII are intact. Muscle strength 4/5 in all extremities. Sensation intact. Gait not checked.  PSYCHIATRIC: The patient is alert and oriented x 3.  SKIN: No obvious rash, lesion, or ulcer.   DATA REVIEW:   CBC  Recent Labs Lab 08/04/15 0644  WBC 14.2*  HGB 8.7*  HCT 27.7*  PLT 450*    Chemistries   Recent Labs Lab 08/04/15 0644  NA 139  K 3.8  CL 102  CO2 33*  GLUCOSE 123*  BUN 18  CREATININE 0.89  CALCIUM 8.7*    Cardiac Enzymes  Recent Labs Lab 08/04/15 0644  TROPONINI 0.04*    Microbiology Results  Results for orders placed or performed during the hospital encounter of 08/03/15  MRSA PCR Screening     Status: Abnormal   Collection Time: 08/04/15  6:34 AM  Result Value Ref Range Status   MRSA by PCR POSITIVE (A) NEGATIVE Final    Comment:        The  GeneXpert MRSA Assay (FDA approved for NASAL specimens only), is one component of a comprehensive MRSA colonization surveillance program. It is not intended to diagnose MRSA infection nor to guide or monitor treatment for MRSA infections. CRITICAL RESULT CALLED TO, READ BACK BY AND VERIFIED WITH: LEAH LEE ON 08/04/15 AT [id number] BY [facility]     RADIOLOGY:  Dg Chest 2 View  08/03/2015  CLINICAL DATA:  Shortness of breath. EXAM: CHEST  2 VIEW COMPARISON:  None. FINDINGS: The heart size is moderately enlarged. There is a tracheostomy tube with tip above the carina. Pulmonary vascular congestion is noted. No airspace consolidation. No pleural effusion. Scoliosis deformity involves the thoracic spine. IMPRESSION: 1. Cardiac enlargement and pulmonary vascular congestion. Electronically Signed   By: Kerby Moors M.D.   On: 08/03/2015 16:11        Management plans discussed with the patient, family and they are in agreement.  CODE STATUS:     Code Status Orders        Start     Ordered   08/04/15 0046  Full code   Continuous     08/04/15 0045    Code Status History     Date Active Date Inactive Code Status Order ID Comments User Context   This patient has a current code status but no historical code status.      TOTAL TIME TAKING CARE OF THIS PATIENT: 26 minutes.    Demetrios Loll M.D on 08/04/2015 at 8:55 AM  Between 7am to 6pm - Pager - [phone]  After 6pm go to [email or web address] - password [email or web address] [facility]  [facility]  Office  [phone]  CC: Primary care physician; No primary care provider on file.

## 2015-08-04 NOTE — Progress Notes (Signed)
Report called to [place], [name]. Will remove IV's and tele once EMS returns with stretcher.

## 2015-08-04 NOTE — Progress Notes (Signed)
Pt pulled trach out. Respiratory at bedside. New trach inserted by respiratory. Vital signs stable. Pt very anxious. 1 mg of dilaudid given. Will continue to monitor.   Iran Sizer M

## 2015-08-05 LAB — TYPE AND SCREEN
ABO/RH(D): O POS
ANTIBODY SCREEN: NEGATIVE
Unit division: 0

## 2015-08-07 ENCOUNTER — Emergency Department
Admission: EM | Admit: 2015-08-07 | Discharge: 2015-08-07 | Disposition: A | Discharge status: Skilled Nursing Facility | Payer: Medicaid Other | Attending: Emergency Medicine | Admitting: Emergency Medicine

## 2015-08-07 ENCOUNTER — Encounter: Payer: Self-pay | Admitting: *Deleted

## 2015-08-07 DIAGNOSIS — N939 Abnormal uterine and vaginal bleeding, unspecified: Secondary | ICD-10-CM | POA: Diagnosis not present

## 2015-08-07 DIAGNOSIS — I5022 Chronic systolic (congestive) heart failure: Secondary | ICD-10-CM | POA: Diagnosis not present

## 2015-08-07 DIAGNOSIS — Z79899 Other long term (current) drug therapy: Secondary | ICD-10-CM | POA: Insufficient documentation

## 2015-08-07 DIAGNOSIS — E785 Hyperlipidemia, unspecified: Secondary | ICD-10-CM | POA: Insufficient documentation

## 2015-08-07 DIAGNOSIS — Z8542 Personal history of malignant neoplasm of other parts of uterus: Secondary | ICD-10-CM | POA: Diagnosis not present

## 2015-08-07 DIAGNOSIS — R531 Weakness: Secondary | ICD-10-CM | POA: Diagnosis present

## 2015-08-07 DIAGNOSIS — F329 Major depressive disorder, single episode, unspecified: Secondary | ICD-10-CM | POA: Diagnosis not present

## 2015-08-07 DIAGNOSIS — E119 Type 2 diabetes mellitus without complications: Secondary | ICD-10-CM | POA: Diagnosis not present

## 2015-08-07 DIAGNOSIS — I110 Hypertensive heart disease with heart failure: Secondary | ICD-10-CM | POA: Insufficient documentation

## 2015-08-07 DIAGNOSIS — G8929 Other chronic pain: Secondary | ICD-10-CM | POA: Insufficient documentation

## 2015-08-07 HISTORY — DX: Encounter for other specified aftercare: Z51.89

## 2015-08-07 HISTORY — DX: Malignant (primary) neoplasm, unspecified: C80.1

## 2015-08-07 LAB — CBC WITH DIFFERENTIAL/PLATELET
BASOS ABS: 0.1 10*3/uL (ref 0–0.1)
BASOS PCT: 1 %
Eosinophils Absolute: 0.4 10*3/uL (ref 0–0.7)
Eosinophils Relative: 3 %
HEMATOCRIT: 29.4 % — AB (ref 35.0–47.0)
Hemoglobin: 9 g/dL — ABNORMAL LOW (ref 12.0–16.0)
Lymphocytes Relative: 15 %
Lymphs Abs: 2 10*3/uL (ref 1.0–3.6)
MCH: 25.9 pg — ABNORMAL LOW (ref 26.0–34.0)
MCHC: 30.6 g/dL — ABNORMAL LOW (ref 32.0–36.0)
MCV: 84.7 fL (ref 80.0–100.0)
MONO ABS: 0.9 10*3/uL (ref 0.2–0.9)
Monocytes Relative: 6 %
NEUTROS ABS: 10.5 10*3/uL — AB (ref 1.4–6.5)
NEUTROS PCT: 75 %
Platelets: 474 10*3/uL — ABNORMAL HIGH (ref 150–440)
RBC: 3.47 MIL/uL — ABNORMAL LOW (ref 3.80–5.20)
RDW: 18.1 % — AB (ref 11.5–14.5)
WBC: 13.9 10*3/uL — ABNORMAL HIGH (ref 3.6–11.0)

## 2015-08-07 LAB — BASIC METABOLIC PANEL
ANION GAP: 8 (ref 5–15)
BUN: 16 mg/dL (ref 6–20)
CALCIUM: 9.1 mg/dL (ref 8.9–10.3)
CO2: 31 mmol/L (ref 22–32)
Chloride: 99 mmol/L — ABNORMAL LOW (ref 101–111)
Creatinine, Ser: 0.84 mg/dL (ref 0.44–1.00)
Glucose, Bld: 102 mg/dL — ABNORMAL HIGH (ref 65–99)
Potassium: 3.9 mmol/L (ref 3.5–5.1)
Sodium: 138 mmol/L (ref 135–145)

## 2015-08-07 LAB — APTT: aPTT: 31 seconds (ref 24–36)

## 2015-08-07 LAB — PROTIME-INR
INR: 1.06
PROTHROMBIN TIME: 14 s (ref 11.4–15.0)

## 2015-08-07 LAB — TYPE AND SCREEN
ABO/RH(D): O POS
ANTIBODY SCREEN: NEGATIVE

## 2015-08-07 LAB — TROPONIN I: Troponin I: 0.04 ng/mL — ABNORMAL HIGH (ref <0.031)

## 2015-08-07 MED ORDER — ACETAMINOPHEN 325 MG PO TABS
650.0000 mg | ORAL_TABLET | Freq: Once | ORAL | Status: AC
  Administered 2015-08-07: 650 mg via ORAL
  Filled 2015-08-07: qty 2

## 2015-08-07 NOTE — ED Provider Notes (Signed)
[facility] Emergency Department Provider Note   ____________________________________________  Time seen: ~1525  I have reviewed the triage vital signs and the nursing notes.   HISTORY  Chief Complaint Weakness   History limited by: Not Limited   HPI Sherry Ramsey is a 35 y.o. female who presents to the emergency department today because of concern for lower abdominal pain, vaginal bleeding, weakness. She states that these symptoms started two days ago. She had had similar symptoms previously and was seen in the emergency department four days ago for the same symptoms. At that time she was noted to have a hemoglobin of 8.1. She was admitted and had a blood transfusion for symptomatic anemia. She states that she did feel better after that. The patient went to an ob/gyn follow up appointment today. Per chart review she had a biopsy return as adenocarcinoma. She then chose to be transported to the emergency department. Denies any chest pain or fevers.     Past Medical History  Diagnosis Date  . Diabetes ([place])   . HTN (hypertension)   . HLD (hyperlipidemia)   . Chronic pain   . CHF (congestive heart failure) ([place])   . MDD (major depressive disorder) ([place])   . Anxiety   . Blood transfusion without reported diagnosis   . Cancer [facility])     Uterine    Patient Active Problem List   Diagnosis Date Noted  . Type 2 diabetes mellitus ([place]) 08/03/2015  . HTN (hypertension) 08/03/2015  . HLD (hyperlipidemia) 08/03/2015  . Chronic systolic CHF (congestive heart failure) ([place]) 08/03/2015  . Elevated troponin 08/03/2015  . Symptomatic anemia 08/03/2015    Past Surgical History  Procedure Laterality Date  . Tracheostomy      Current Outpatient Rx  Name  Route  Sig  Dispense  Refill  . atorvastatin (LIPITOR) 10 MG tablet   Oral   Take 10 mg by mouth at bedtime.         . butalbital-acetaminophen-caffeine (FIORICET, ESGIC) 50-325-40 MG tablet   Oral   Take  1 tablet by mouth every 4 (four) hours as needed for headache.         . carvedilol (COREG) 6.25 MG tablet   Oral   Take 6.25 mg by mouth 2 (two) times daily with a meal.         . clonazePAM (KLONOPIN) 0.5 MG tablet   Oral   Take 1 mg by mouth at bedtime.         . diphenhydrAMINE (BENADRYL) 25 MG tablet   Oral   Take 25 mg by mouth every 6 (six) hours as needed for itching.         . furosemide (LASIX) 40 MG tablet   Oral   Take 40 mg by mouth.         Marland Kitchen guaiFENesin (MUCINEX) 600 MG 12 hr tablet   Oral   Take 600 mg by mouth 2 (two) times daily as needed for cough.         Marland Kitchen HYDROmorphone (DILAUDID) 2 MG tablet   Oral   Take 2 mg by mouth every 4 (four) hours as needed for severe pain.         Marland Kitchen lisinopril (PRINIVIL,ZESTRIL) 5 MG tablet   Oral   Take 5 mg by mouth daily.         . medroxyPROGESTERone (PROVERA) 10 MG tablet   Oral   Take 10 mg by mouth daily.         Marland Kitchen  Melatonin 5 MG TABS   Oral   Take 10 mg by mouth at bedtime.         . Multiple Vitamin (MULTIVITAMIN WITH MINERALS) TABS tablet   Oral   Take 1 tablet by mouth daily.         Marland Kitchen senna (SENOKOT) 8.6 MG tablet   Oral   Take 2 tablets by mouth 2 (two) times daily.         Marland Kitchen EXPIRED: sertraline (ZOLOFT) 50 MG tablet   Oral   Take 50 mg by mouth daily.         Marland Kitchen topiramate (TOPAMAX) 25 MG tablet   Oral   Take 25 mg by mouth 2 (two) times daily.         . traZODone (DESYREL) 50 MG tablet   Oral   Take 200 mg by mouth at bedtime.           Allergies Ibuprofen  Family History  Problem Relation Age of Onset  . Diabetes Neg Hx   . Heart failure Neg Hx     Social History Social History  Substance Use Topics  . Smoking status: Never Smoker   . Smokeless tobacco: None  . Alcohol Use: No    Review of Systems  Constitutional: Negative for fever. Cardiovascular: Negative for chest pain. Respiratory: Negative for shortness of breath. Gastrointestinal:  Positive for lower abdominal pain. Neurological: Negative for headaches, focal weakness or numbness.  10-point ROS otherwise negative.  ____________________________________________   PHYSICAL EXAM:  VITAL SIGNS: ED Triage Vitals  Enc Vitals Group     BP 08/07/15 1452 119/80 mmHg     Pulse Rate 08/07/15 1452 85     Resp 08/07/15 1452 22     Temp 08/07/15 1452 98.3 F (36.8 C)     Temp Source 08/07/15 1452 Oral     SpO2 08/07/15 1452 100 %     Weight 08/07/15 1452 542 lb 8.8 oz (246.1 kg)     Height --      Head Cir --      Peak Flow --      Pain Score 08/07/15 1455 10   Constitutional: Awake and alert. Morbidly obese. Trach in place.  Eyes: Conjunctivae are normal. PERRL. Normal extraocular movements. ENT   Head: Normocephalic and atraumatic.   Nose: No congestion/rhinnorhea.   Mouth/Throat: Mucous membranes are moist.   Neck: No stridor. Hematological/Lymphatic/Immunilogical: No cervical lymphadenopathy. Cardiovascular: Normal rate, regular rhythm.  Exam limited by body habitus.  Respiratory: Normal respiratory effort without tachypnea nor retractions. Exam limited by body habitus.  Gastrointestinal: Morbidly obese. No obvious tenderness.  Genitourinary: Deferred Musculoskeletal: Normal range of motion in all extremities. No joint effusions.  No lower extremity tenderness nor edema. Neurologic:  Normal speech and language. No gross focal neurologic deficits are appreciated.  Skin:  Skin is warm, dry and intact. No rash noted. Psychiatric: Mood and affect are normal. Speech and behavior are normal. Patient exhibits appropriate insight and judgment.  ____________________________________________    LABS (pertinent positives/negatives)  Labs Reviewed  CBC WITH DIFFERENTIAL/PLATELET - Abnormal; Notable for the following:    WBC 13.9 (*)    RBC 3.47 (*)    Hemoglobin 9.0 (*)    HCT 29.4 (*)    MCH 25.9 (*)    MCHC 30.6 (*)    RDW 18.1 (*)    Platelets  474 (*)    Neutro Abs 10.5 (*)    All other components within normal limits  BASIC METABOLIC PANEL - Abnormal; Notable for the following:    Chloride 99 (*)    Glucose, Bld 102 (*)    All other components within normal limits  TROPONIN I - Abnormal; Notable for the following:    Troponin I 0.04 (*)    All other components within normal limits  PROTIME-INR  APTT  TYPE AND SCREEN     ____________________________________________   EKG  None  ____________________________________________    RADIOLOGY  None  ____________________________________________   PROCEDURES  Procedure(s) performed: None  Critical Care performed: No  ____________________________________________   INITIAL IMPRESSION / ASSESSMENT AND PLAN / ED COURSE  Pertinent labs & imaging results that were available during my care of the patient were reviewed by me and considered in my medical decision making (see chart for details).  Patient presented to the emergency department today because of concerns for continued vaginal bleeding, weakness and some pelvic pain. Patient hemoglobin today actually improved over discharge hemoglobin for recent anemia admission. Additionally patient had seen her OB/GYN doctor earlier today and was diagnosed with adenocarcinoma. Blood work today without any signs of infection. Patient afebrile here. Unfortunately do wonder if part of the patient's concern is over her recent diagnosis. I did discuss with patient that she is being scheduled for follow-up with gynecology oncology. This point do not feel like any emergent imaging is warranted. We will discharge to follow-up with OB/GYN.  ____________________________________________   FINAL CLINICAL IMPRESSION(S) / ED DIAGNOSES  Final diagnoses:  Vaginal bleeding  Weakness     Nance Pear, MD 08/07/15 1954

## 2015-08-07 NOTE — ED Notes (Signed)
Several people present to assist patient to the stretcher. Patient left via EMS before signing discharge.

## 2015-08-07 NOTE — ED Notes (Addendum)
Per EMS report, patient was seen here 4/9 for same complaint and was given 1 unit of blood on 4/10. Patient c/o weakness, taste of metal and return of vaginal bleeding. Patient arrived on 3L O2 via [place] and trach in place. Patient is from [facility].

## 2015-08-07 NOTE — ED Notes (Signed)
EMS is on scene. Patient told EMS that she needed a new inner cannula before she can leave. Respiratory has been called.

## 2015-08-07 NOTE — ED Notes (Signed)
Patient refused Tylenol PO, asked for IV Dilaudid. Dr. Archie Balboa aware.

## 2015-08-07 NOTE — ED Notes (Signed)
Patient has taken off pulse ox and B/P cuff and is refusing more VS at this time.

## 2015-08-07 NOTE — ED Notes (Signed)
Dr. Archie Balboa at bedside to assess trach. Patient is ok to be transported.

## 2015-08-07 NOTE — Discharge Instructions (Signed)
Please seek medical attention for any high fevers, chest pain, shortness of breath, change in behavior, persistent vomiting, bloody stool or any other new or concerning symptoms.   Abnormal Uterine Bleeding Abnormal uterine bleeding means bleeding from the vagina that is not your normal menstrual period. This can be:  Bleeding or spotting between periods.  Bleeding after sex (sexual intercourse).  Bleeding that is heavier or more than normal.  Periods that last longer than usual.  Bleeding after menopause. There are many problems that may cause this. Treatment will depend on the cause of the bleeding. Any kind of bleeding that is not normal should be reviewed by your doctor.  HOME CARE Watch your condition for any changes. These actions may lessen any discomfort you are having:  Do not use tampons or douches as told by your doctor.  Change your pads often. You should get regular pelvic exams and Pap tests. Keep all appointments for tests as told by your doctor. GET HELP IF:  You are bleeding for more than 1 week.  You feel dizzy at times. GET HELP RIGHT AWAY IF:   You pass out.  You have to change pads every 15 to 30 minutes.  You have belly pain.  You have a fever.  You become sweaty or weak.  You are passing large blood clots from the vagina.  You feel sick to your stomach (nauseous) and throw up (vomit). MAKE SURE YOU:  Understand these instructions.  Will watch your condition.  Will get help right away if you are not doing well or get worse.   This information is not intended to replace advice given to you by your health care provider. Make sure you discuss any questions you have with your health care provider.   Document Released: 02/07/2009 Document Revised: 04/17/2013 Document Reviewed: 11/09/2012 Elsevier Interactive Patient Education [employer].

## 2015-08-13 ENCOUNTER — Emergency Department: Payer: Medicaid Other

## 2015-08-13 ENCOUNTER — Inpatient Hospital Stay
Admission: EM | Admit: 2015-08-13 | Discharge: 2015-08-20 | DRG: 208 | Disposition: A | Discharge status: Other Acute Inpatient Hospital | Payer: Medicaid Other | Attending: Internal Medicine | Admitting: Internal Medicine

## 2015-08-13 DIAGNOSIS — N939 Abnormal uterine and vaginal bleeding, unspecified: Secondary | ICD-10-CM | POA: Diagnosis present

## 2015-08-13 DIAGNOSIS — Z8249 Family history of ischemic heart disease and other diseases of the circulatory system: Secondary | ICD-10-CM | POA: Diagnosis not present

## 2015-08-13 DIAGNOSIS — G4733 Obstructive sleep apnea (adult) (pediatric): Secondary | ICD-10-CM

## 2015-08-13 DIAGNOSIS — J208 Acute bronchitis due to other specified organisms: Secondary | ICD-10-CM | POA: Diagnosis present

## 2015-08-13 DIAGNOSIS — E119 Type 2 diabetes mellitus without complications: Secondary | ICD-10-CM | POA: Diagnosis present

## 2015-08-13 DIAGNOSIS — D500 Iron deficiency anemia secondary to blood loss (chronic): Secondary | ICD-10-CM | POA: Diagnosis present

## 2015-08-13 DIAGNOSIS — B9562 Methicillin resistant Staphylococcus aureus infection as the cause of diseases classified elsewhere: Secondary | ICD-10-CM | POA: Diagnosis present

## 2015-08-13 DIAGNOSIS — J209 Acute bronchitis, unspecified: Secondary | ICD-10-CM | POA: Diagnosis not present

## 2015-08-13 DIAGNOSIS — Z6841 Body Mass Index (BMI) 40.0 and over, adult: Secondary | ICD-10-CM | POA: Diagnosis not present

## 2015-08-13 DIAGNOSIS — Z930 Tracheostomy status: Secondary | ICD-10-CM

## 2015-08-13 DIAGNOSIS — J441 Chronic obstructive pulmonary disease with (acute) exacerbation: Secondary | ICD-10-CM | POA: Diagnosis present

## 2015-08-13 DIAGNOSIS — Z4659 Encounter for fitting and adjustment of other gastrointestinal appliance and device: Secondary | ICD-10-CM

## 2015-08-13 DIAGNOSIS — I429 Cardiomyopathy, unspecified: Secondary | ICD-10-CM | POA: Diagnosis present

## 2015-08-13 DIAGNOSIS — I110 Hypertensive heart disease with heart failure: Secondary | ICD-10-CM | POA: Diagnosis present

## 2015-08-13 DIAGNOSIS — E785 Hyperlipidemia, unspecified: Secondary | ICD-10-CM | POA: Diagnosis present

## 2015-08-13 DIAGNOSIS — J9601 Acute respiratory failure with hypoxia: Secondary | ICD-10-CM | POA: Diagnosis present

## 2015-08-13 DIAGNOSIS — F418 Other specified anxiety disorders: Secondary | ICD-10-CM | POA: Diagnosis present

## 2015-08-13 DIAGNOSIS — J9622 Acute and chronic respiratory failure with hypercapnia: Secondary | ICD-10-CM | POA: Diagnosis not present

## 2015-08-13 DIAGNOSIS — R6510 Systemic inflammatory response syndrome (SIRS) of non-infectious origin without acute organ dysfunction: Secondary | ICD-10-CM | POA: Diagnosis present

## 2015-08-13 DIAGNOSIS — J9600 Acute respiratory failure, unspecified whether with hypoxia or hypercapnia: Secondary | ICD-10-CM | POA: Insufficient documentation

## 2015-08-13 DIAGNOSIS — Z79899 Other long term (current) drug therapy: Secondary | ICD-10-CM | POA: Diagnosis not present

## 2015-08-13 DIAGNOSIS — R0600 Dyspnea, unspecified: Secondary | ICD-10-CM

## 2015-08-13 DIAGNOSIS — E662 Morbid (severe) obesity with alveolar hypoventilation: Secondary | ICD-10-CM | POA: Diagnosis present

## 2015-08-13 DIAGNOSIS — J189 Pneumonia, unspecified organism: Secondary | ICD-10-CM | POA: Diagnosis present

## 2015-08-13 DIAGNOSIS — I248 Other forms of acute ischemic heart disease: Secondary | ICD-10-CM | POA: Diagnosis present

## 2015-08-13 DIAGNOSIS — Z87891 Personal history of nicotine dependence: Secondary | ICD-10-CM | POA: Diagnosis not present

## 2015-08-13 DIAGNOSIS — J440 Chronic obstructive pulmonary disease with acute lower respiratory infection: Secondary | ICD-10-CM | POA: Diagnosis present

## 2015-08-13 DIAGNOSIS — C541 Malignant neoplasm of endometrium: Secondary | ICD-10-CM | POA: Diagnosis present

## 2015-08-13 DIAGNOSIS — Z9119 Patient's noncompliance with other medical treatment and regimen: Secondary | ICD-10-CM | POA: Diagnosis not present

## 2015-08-13 DIAGNOSIS — J449 Chronic obstructive pulmonary disease, unspecified: Secondary | ICD-10-CM

## 2015-08-13 DIAGNOSIS — I5032 Chronic diastolic (congestive) heart failure: Secondary | ICD-10-CM | POA: Diagnosis present

## 2015-08-13 DIAGNOSIS — R0602 Shortness of breath: Secondary | ICD-10-CM

## 2015-08-13 DIAGNOSIS — J9621 Acute and chronic respiratory failure with hypoxia: Secondary | ICD-10-CM | POA: Diagnosis present

## 2015-08-13 DIAGNOSIS — A419 Sepsis, unspecified organism: Secondary | ICD-10-CM

## 2015-08-13 DIAGNOSIS — J9602 Acute respiratory failure with hypercapnia: Secondary | ICD-10-CM | POA: Diagnosis not present

## 2015-08-13 DIAGNOSIS — J9690 Respiratory failure, unspecified, unspecified whether with hypoxia or hypercapnia: Secondary | ICD-10-CM | POA: Diagnosis present

## 2015-08-13 DIAGNOSIS — Z95828 Presence of other vascular implants and grafts: Secondary | ICD-10-CM

## 2015-08-13 LAB — CBC
HEMATOCRIT: 26.4 % — AB (ref 35.0–47.0)
Hemoglobin: 8 g/dL — ABNORMAL LOW (ref 12.0–16.0)
MCH: 24.8 pg — AB (ref 26.0–34.0)
MCHC: 30.3 g/dL — AB (ref 32.0–36.0)
MCV: 81.7 fL (ref 80.0–100.0)
Platelets: 487 10*3/uL — ABNORMAL HIGH (ref 150–440)
RBC: 3.23 MIL/uL — ABNORMAL LOW (ref 3.80–5.20)
RDW: 18.8 % — AB (ref 11.5–14.5)
WBC: 21.2 10*3/uL — ABNORMAL HIGH (ref 3.6–11.0)

## 2015-08-13 LAB — TROPONIN I
Troponin I: 0.04 ng/mL — ABNORMAL HIGH (ref <0.031)
Troponin I: 0.05 ng/mL — ABNORMAL HIGH (ref <0.031)
Troponin I: 0.04 ng/mL — ABNORMAL HIGH (ref <0.031)

## 2015-08-13 LAB — FERRITIN: FERRITIN: 21 ng/mL (ref 11–307)

## 2015-08-13 LAB — EXPECTORATED SPUTUM ASSESSMENT W REFEX TO RESP CULTURE

## 2015-08-13 LAB — BASIC METABOLIC PANEL
Anion gap: 5 (ref 5–15)
BUN: 25 mg/dL — AB (ref 6–20)
CALCIUM: 8.8 mg/dL — AB (ref 8.9–10.3)
CO2: 33 mmol/L — AB (ref 22–32)
Chloride: 97 mmol/L — ABNORMAL LOW (ref 101–111)
Creatinine, Ser: 0.93 mg/dL (ref 0.44–1.00)
GFR calc Af Amer: >60 mL/min (ref >60)
GLUCOSE: 131 mg/dL — AB (ref 65–99)
POTASSIUM: 4.1 mmol/L (ref 3.5–5.1)
Sodium: 135 mmol/L (ref 135–145)

## 2015-08-13 LAB — LACTIC ACID, PLASMA
Lactic Acid, Venous: 0.6 mmol/L (ref 0.5–2.0)
Lactic Acid, Venous: 0.8 mmol/L (ref 0.5–2.0)

## 2015-08-13 LAB — GLUCOSE, CAPILLARY: Glucose-Capillary: 106 mg/dL — ABNORMAL HIGH (ref 65–99)

## 2015-08-13 LAB — MRSA PCR SCREENING: MRSA by PCR: POSITIVE — AB

## 2015-08-13 LAB — EXPECTORATED SPUTUM ASSESSMENT W GRAM STAIN, RFLX TO RESP C: Special Requests: NORMAL

## 2015-08-13 LAB — VITAMIN B12: Vitamin B-12: 364 pg/mL (ref 180–914)

## 2015-08-13 MED ORDER — MEGESTROL ACETATE 40 MG PO TABS: 80.0000 mg | ORAL_TABLET | Freq: Two times a day (BID) | ORAL | Status: DC

## 2015-08-13 MED ORDER — VANCOMYCIN HCL 10 G IV SOLR
1750.0000 mg | Freq: Once | INTRAVENOUS | Status: AC
  Administered 2015-08-13: 1750 mg via INTRAVENOUS
  Filled 2015-08-13: qty 1750

## 2015-08-13 MED ORDER — SODIUM CHLORIDE 0.9 % IV SOLN
1500.0000 mg | Freq: Three times a day (TID) | INTRAVENOUS | Status: DC
  Administered 2015-08-13 – 2015-08-14 (×3): 1500 mg via INTRAVENOUS
  Filled 2015-08-13 (×6): qty 1500

## 2015-08-13 MED ORDER — LEVOFLOXACIN IN D5W 750 MG/150ML IV SOLN
750.0000 mg | INTRAVENOUS | Status: DC
  Administered 2015-08-14 – 2015-08-17 (×4): 750 mg via INTRAVENOUS
  Filled 2015-08-13 (×5): qty 150

## 2015-08-13 MED ORDER — GUAIFENESIN ER 600 MG PO TB12: 600.0000 mg | ORAL_TABLET | Freq: Two times a day (BID) | ORAL | Status: DC | PRN

## 2015-08-13 MED ORDER — ALBUTEROL SULFATE (2.5 MG/3ML) 0.083% IN NEBU
2.5000 mg | INHALATION_SOLUTION | RESPIRATORY_TRACT | Status: DC | PRN
  Administered 2015-08-19: 2.5 mg via RESPIRATORY_TRACT
  Filled 2015-08-13: qty 3

## 2015-08-13 MED ORDER — IPRATROPIUM-ALBUTEROL 0.5-2.5 (3) MG/3ML IN SOLN
3.0000 mL | Freq: Four times a day (QID) | RESPIRATORY_TRACT | Status: DC
  Administered 2015-08-13 – 2015-08-20 (×28): 3 mL via RESPIRATORY_TRACT
  Filled 2015-08-13 (×30): qty 3

## 2015-08-13 MED ORDER — ACETAMINOPHEN 650 MG RE SUPP
650.0000 mg | Freq: Four times a day (QID) | RECTAL | Status: DC | PRN
Start: 2015-08-13 — End: 2015-08-21
  Filled 2015-08-13: qty 1

## 2015-08-13 MED ORDER — METHYLPREDNISOLONE SODIUM SUCC 125 MG IJ SOLR
60.0000 mg | Freq: Every day | INTRAMUSCULAR | Status: DC
  Administered 2015-08-13 – 2015-08-17 (×5): 60 mg via INTRAVENOUS
  Filled 2015-08-13 (×5): qty 2

## 2015-08-13 MED ORDER — BUTALBITAL-APAP-CAFFEINE 50-325-40 MG PO TABS: 1.0000 | ORAL_TABLET | ORAL | Status: DC | PRN

## 2015-08-13 MED ORDER — LISINOPRIL 5 MG PO TABS
5.0000 mg | ORAL_TABLET | Freq: Every day | ORAL | Status: DC
  Administered 2015-08-13: 5 mg via ORAL
  Filled 2015-08-13 (×2): qty 1

## 2015-08-13 MED ORDER — ATORVASTATIN CALCIUM 10 MG PO TABS
10.0000 mg | ORAL_TABLET | Freq: Every day | ORAL | Status: DC
  Administered 2015-08-13: 10 mg via ORAL
  Filled 2015-08-13: qty 1

## 2015-08-13 MED ORDER — MEDROXYPROGESTERONE ACETATE 10 MG PO TABS
10.0000 mg | ORAL_TABLET | Freq: Every day | ORAL | Status: DC
  Administered 2015-08-13: 10 mg via ORAL
  Filled 2015-08-13: qty 1

## 2015-08-13 MED ORDER — MELATONIN 5 MG PO TABS: 10.0000 mg | ORAL_TABLET | Freq: Every day | ORAL | Status: DC

## 2015-08-13 MED ORDER — BUDESONIDE 0.25 MG/2ML IN SUSP
0.2500 mg | Freq: Two times a day (BID) | RESPIRATORY_TRACT | Status: DC
Start: 2015-08-13 — End: 2015-08-21
  Administered 2015-08-13 – 2015-08-20 (×15): 0.25 mg via RESPIRATORY_TRACT
  Filled 2015-08-13 (×15): qty 2

## 2015-08-13 MED ORDER — ADULT MULTIVITAMIN W/MINERALS CH
1.0000 | ORAL_TABLET | Freq: Every day | ORAL | Status: DC
  Administered 2015-08-13: 1 via ORAL
  Filled 2015-08-13: qty 1

## 2015-08-13 MED ORDER — IPRATROPIUM-ALBUTEROL 0.5-2.5 (3) MG/3ML IN SOLN
3.0000 mL | Freq: Once | RESPIRATORY_TRACT | Status: AC
  Administered 2015-08-13: 3 mL via RESPIRATORY_TRACT

## 2015-08-13 MED ORDER — PIPERACILLIN-TAZOBACTAM 4.5 G IVPB
4.5000 g | Freq: Three times a day (TID) | INTRAVENOUS | Status: DC
  Administered 2015-08-13 – 2015-08-18 (×15): 4.5 g via INTRAVENOUS
  Filled 2015-08-13 (×19): qty 100

## 2015-08-13 MED ORDER — DIPHENHYDRAMINE HCL 25 MG PO TABS
25.0000 mg | ORAL_TABLET | Freq: Four times a day (QID) | ORAL | Status: DC | PRN
  Filled 2015-08-13: qty 1

## 2015-08-13 MED ORDER — MEGESTROL ACETATE 40 MG PO TABS
320.0000 mg | ORAL_TABLET | Freq: Two times a day (BID) | ORAL | Status: AC
  Administered 2015-08-13: 320 mg via ORAL
  Filled 2015-08-13 (×2): qty 8

## 2015-08-13 MED ORDER — CARVEDILOL 6.25 MG PO TABS
6.2500 mg | ORAL_TABLET | Freq: Two times a day (BID) | ORAL | Status: DC
  Administered 2015-08-13 – 2015-08-14 (×2): 6.25 mg via ORAL
  Filled 2015-08-13 (×3): qty 1

## 2015-08-13 MED ORDER — FUROSEMIDE 10 MG/ML IJ SOLN
40.0000 mg | Freq: Every day | INTRAMUSCULAR | Status: DC
  Administered 2015-08-13 – 2015-08-19 (×7): 40 mg via INTRAVENOUS
  Filled 2015-08-13 (×7): qty 4

## 2015-08-13 MED ORDER — TOPIRAMATE 25 MG PO TABS
25.0000 mg | ORAL_TABLET | Freq: Two times a day (BID) | ORAL | Status: DC
  Administered 2015-08-13 (×2): 25 mg via ORAL
  Filled 2015-08-13 (×3): qty 1

## 2015-08-13 MED ORDER — ALBUTEROL SULFATE (2.5 MG/3ML) 0.083% IN NEBU
INHALATION_SOLUTION | RESPIRATORY_TRACT | Status: AC
  Administered 2015-08-13: 2.5 mg
  Filled 2015-08-13: qty 3

## 2015-08-13 MED ORDER — SENNA 8.6 MG PO TABS
2.0000 | ORAL_TABLET | Freq: Two times a day (BID) | ORAL | Status: DC
  Filled 2015-08-13 (×4): qty 2

## 2015-08-13 MED ORDER — TRAMADOL HCL 50 MG PO TABS
100.0000 mg | ORAL_TABLET | Freq: Once | ORAL | Status: AC
  Administered 2015-08-13: 100 mg via ORAL
  Filled 2015-08-13: qty 2

## 2015-08-13 MED ORDER — CLONAZEPAM 1 MG PO TABS
1.0000 mg | ORAL_TABLET | Freq: Every day | ORAL | Status: DC
  Administered 2015-08-13: 1 mg via ORAL
  Filled 2015-08-13: qty 1

## 2015-08-13 MED ORDER — LEVOFLOXACIN IN D5W 750 MG/150ML IV SOLN
750.0000 mg | Freq: Once | INTRAVENOUS | Status: AC
  Administered 2015-08-13: 750 mg via INTRAVENOUS
  Filled 2015-08-13: qty 150

## 2015-08-13 MED ORDER — MEGESTROL ACETATE 40 MG PO TABS
80.0000 mg | ORAL_TABLET | Freq: Two times a day (BID) | ORAL | Status: DC
Start: 2015-08-16 — End: 2015-08-16

## 2015-08-13 MED ORDER — SERTRALINE HCL 50 MG PO TABS
50.0000 mg | ORAL_TABLET | Freq: Every day | ORAL | Status: DC
  Filled 2015-08-13 (×2): qty 1

## 2015-08-13 MED ORDER — PIPERACILLIN-TAZOBACTAM 3.375 G IVPB 30 MIN: 3.3750 g | Freq: Once | INTRAVENOUS | Status: DC

## 2015-08-13 MED ORDER — SODIUM CHLORIDE 0.9% FLUSH
3.0000 mL | Freq: Two times a day (BID) | INTRAVENOUS | Status: DC
  Administered 2015-08-13 – 2015-08-20 (×11): 3 mL via INTRAVENOUS

## 2015-08-13 MED ORDER — ACETAMINOPHEN 325 MG PO TABS
650.0000 mg | ORAL_TABLET | Freq: Four times a day (QID) | ORAL | Status: DC | PRN
  Administered 2015-08-13: 650 mg via ORAL
  Filled 2015-08-13 (×2): qty 2

## 2015-08-13 MED ORDER — VANCOMYCIN HCL IN DEXTROSE 1-5 GM/200ML-% IV SOLN
1000.0000 mg | Freq: Once | INTRAVENOUS | Status: DC
  Filled 2015-08-13: qty 200

## 2015-08-13 MED ORDER — HYDROMORPHONE HCL 2 MG PO TABS
2.0000 mg | ORAL_TABLET | ORAL | Status: DC | PRN
  Administered 2015-08-13 – 2015-08-14 (×3): 2 mg via ORAL
  Filled 2015-08-13 (×3): qty 1

## 2015-08-13 MED ORDER — IPRATROPIUM-ALBUTEROL 0.5-2.5 (3) MG/3ML IN SOLN
RESPIRATORY_TRACT | Status: AC
  Administered 2015-08-13: 3 mL via RESPIRATORY_TRACT
  Filled 2015-08-13: qty 6

## 2015-08-13 MED ORDER — TRAZODONE HCL 100 MG PO TABS: 200.0000 mg | ORAL_TABLET | Freq: Every day | ORAL | Status: DC

## 2015-08-13 NOTE — ED Notes (Signed)
Pt informed of pending move   NAD assessed

## 2015-08-13 NOTE — ED Provider Notes (Signed)
[facility] Emergency Department Provider Note  ____________________________________________  Time seen: 6:15 AM  I have reviewed the triage vital signs and the nursing notes.   HISTORY  Chief Complaint Shortness of Breath      HPI Sherry Ramsey is a 35 y.o. female presents with shortness of breath subjective fevers cough increased secretions from tracheostomy 2 weeks. Per EMS on their arrival patient's oxygen saturation was 80% for which EMS administered a DuoNeb while in route. She admits to chest discomfort with coughing. Patient in the parasternal distress on arrival to the emergency department oxygen saturation 85% with a respiratory rate of 27 tachycardic with heart rate of 112     Past Medical History  Diagnosis Date  . Diabetes ([place])   . HTN (hypertension)   . HLD (hyperlipidemia)   . Chronic pain   . CHF (congestive heart failure) ([place])   . MDD (major depressive disorder) ([place])   . Anxiety   . Blood transfusion without reported diagnosis   . Cancer [facility])     Uterine    Patient Active Problem List   Diagnosis Date Noted  . Type 2 diabetes mellitus ([place]) 08/03/2015  . HTN (hypertension) 08/03/2015  . HLD (hyperlipidemia) 08/03/2015  . Chronic systolic CHF (congestive heart failure) ([place]) 08/03/2015  . Elevated troponin 08/03/2015  . Symptomatic anemia 08/03/2015    Past Surgical History  Procedure Laterality Date  . Tracheostomy      Current Outpatient Rx  Name  Route  Sig  Dispense  Refill  . atorvastatin (LIPITOR) 10 MG tablet   Oral   Take 10 mg by mouth at bedtime.         . butalbital-acetaminophen-caffeine (FIORICET, ESGIC) 50-325-40 MG tablet   Oral   Take 1 tablet by mouth every 4 (four) hours as needed for headache.         . carvedilol (COREG) 6.25 MG tablet   Oral   Take 6.25 mg by mouth 2 (two) times daily with a meal.         . clonazePAM (KLONOPIN) 0.5 MG tablet   Oral   Take 1 mg by mouth at  bedtime.         . diphenhydrAMINE (BENADRYL) 25 MG tablet   Oral   Take 25 mg by mouth every 6 (six) hours as needed for itching.         . furosemide (LASIX) 40 MG tablet   Oral   Take 40 mg by mouth.         Marland Kitchen guaiFENesin (MUCINEX) 600 MG 12 hr tablet   Oral   Take 600 mg by mouth 2 (two) times daily as needed for cough.         Marland Kitchen HYDROmorphone (DILAUDID) 2 MG tablet   Oral   Take 2 mg by mouth every 4 (four) hours as needed for severe pain.         Marland Kitchen lisinopril (PRINIVIL,ZESTRIL) 5 MG tablet   Oral   Take 5 mg by mouth daily.         . medroxyPROGESTERone (PROVERA) 10 MG tablet   Oral   Take 10 mg by mouth daily.         . Melatonin 5 MG TABS   Oral   Take 10 mg by mouth at bedtime.         . Multiple Vitamin (MULTIVITAMIN WITH MINERALS) TABS tablet   Oral   Take 1 tablet by mouth daily.         Marland Kitchen  senna (SENOKOT) 8.6 MG tablet   Oral   Take 2 tablets by mouth 2 (two) times daily.         Marland Kitchen topiramate (TOPAMAX) 25 MG tablet   Oral   Take 25 mg by mouth 2 (two) times daily.         . traZODone (DESYREL) 50 MG tablet   Oral   Take 200 mg by mouth at bedtime.         Marland Kitchen EXPIRED: sertraline (ZOLOFT) 50 MG tablet   Oral   Take 50 mg by mouth daily.           Allergies Ibuprofen  Family History  Problem Relation Age of Onset  . Diabetes Neg Hx   . Heart failure Neg Hx     Social History Social History  Substance Use Topics  . Smoking status: Never Smoker   . Smokeless tobacco: None  . Alcohol Use: No    Review of Systems  Constitutional: Negative for fever. Eyes: Negative for visual changes. ENT: Negative for sore throat. Cardiovascular: Positive for chest pain. Respiratory: Positive for shortness of breath and cough Gastrointestinal: Negative for abdominal pain, vomiting and diarrhea. Genitourinary: Negative for dysuria. Musculoskeletal: Negative for back pain. Skin: Negative for rash. Neurological: Negative for  headaches, focal weakness or numbness.   10-point ROS otherwise negative.  ____________________________________________   PHYSICAL EXAM:  VITAL SIGNS: ED Triage Vitals  Enc Vitals Group     BP 08/13/15 0616 101/83 mmHg     Pulse Rate 08/13/15 0616 112     Resp 08/13/15 0616 27     Temp 08/13/15 0620 98.8 F (37.1 C)     Temp Source 08/13/15 0620 Oral     SpO2 08/13/15 0616 85 %     Weight 08/13/15 0625 542 lb (245.85 kg)     Height 08/13/15 0625 5\' 4"  (1.626 m)     Head Cir --      Peak Flow --      Pain Score --      Pain Loc --      Pain Edu? --      Excl. in [place]? --    Constitutional: Alert and oriented. Apparent respiratory distress Eyes: Conjunctivae are normal. PERRL. Normal extraocular movements. ENT   Head: Normocephalic and atraumatic.   Nose: No congestion/rhinnorhea.   Mouth/Throat: Mucous membranes are moist.   Neck: No stridor. Hematological/Lymphatic/Immunilogical: No cervical lymphadenopathy. Cardiovascular: Normal rate, regular rhythm. Normal and symmetric distal pulses are present in all extremities. No murmurs, rubs, or gallops. Respiratory:Tachypnea, diffuse rhonchi bilaterally, positive accessory respiratory muscle use Gastrointestinal: Soft and nontender. No distention. There is no CVA tenderness. Genitourinary: deferred Musculoskeletal: Nontender with normal range of motion in all extremities. No joint effusions.  No lower extremity tenderness nor edema. Neurologic:  Normal speech and language. No gross focal neurologic deficits are appreciated. Speech is normal.  Skin:  Skin is warm, dry and intact. No rash noted. Psychiatric: Mood and affect are normal. Speech and behavior are normal. Patient exhibits appropriate insight and judgment.  ____________________________________________    LABS (pertinent positives/negatives)  Labs Reviewed  MRSA PCR SCREENING - Abnormal; Notable for the following:    MRSA by PCR POSITIVE (*)    All  other components within normal limits  BASIC METABOLIC PANEL - Abnormal; Notable for the following:    Chloride 97 (*)    CO2 33 (*)    Glucose, Bld 131 (*)    BUN 25 (*)  Calcium 8.8 (*)    All other components within normal limits  CBC - Abnormal; Notable for the following:    WBC 21.2 (*)    RBC 3.23 (*)    Hemoglobin 8.0 (*)    HCT 26.4 (*)    MCH 24.8 (*)    MCHC 30.3 (*)    RDW 18.8 (*)    Platelets 487 (*)    All other components within normal limits  TROPONIN I - Abnormal; Notable for the following:    Troponin I 0.04 (*)    All other components within normal limits  TROPONIN I - Abnormal; Notable for the following:    Troponin I 0.04 (*)    All other components within normal limits  TROPONIN I - Abnormal; Notable for the following:    Troponin I 0.05 (*)    All other components within normal limits  GLUCOSE, CAPILLARY - Abnormal; Notable for the following:    Glucose-Capillary 106 (*)    All other components within normal limits  CULTURE, EXPECTORATED SPUTUM-ASSESSMENT  CULTURE, BLOOD (ROUTINE X 2)  CULTURE, BLOOD (ROUTINE X 2)  CULTURE, RESPIRATORY (NON-EXPECTORATED)  LACTIC ACID, PLASMA  LACTIC ACID, PLASMA  VITAMIN B12  FERRITIN  INFLUENZA PANEL BY PCR (TYPE A & B, 99991111)  BASIC METABOLIC PANEL  CBC    ____________________________________________   EKG  ED ECG REPORT I, [place] N BROWN, the attending physician, personally viewed and interpreted this ECG.   Date: 08/13/2015  EKG Time: 6:15 AM  Rate: 119  Rhythm: Sinus tachycardia  Axis: normal  Intervals: Normal  ST&T Change: None   ____________________________________________    RADIOLOGY DG Chest Port 1 View (Final result) Result time: 08/13/15 06:57:27   Final result by Rad Results In Interface (08/13/15 06:57:27)   Narrative:   CLINICAL DATA: Shortness of breath. History of CHF and hypertension.  EXAM: PORTABLE CHEST 1 VIEW  COMPARISON: 08/03/2015  FINDINGS: Shallow  inspiration. Cardiac enlargement with prominent pulmonary vascularity. Perihilar nodular changes likely represent prominent vessels on end. No definite edema. No blunting of costophrenic angles. No pneumothorax. Tracheostomy.  IMPRESSION: Cardiac enlargement with pulmonary vascular congestion. No definite edema or consolidation.   Electronically Signed By: Lucienne Capers M.D. On: 08/13/2015 06:57        Critical Care performed: CRITICAL CARE Performed by: Gregor Hams   Total critical care time: 45 minutes  Critical care time was exclusive of separately billable procedures and treating other patients.  Critical care was necessary to treat or prevent imminent or life-threatening deterioration.  Critical care was time spent personally by me on the following activities: development of treatment plan with patient and/or surrogate as well as nursing, discussions with consultants, evaluation of patient's response to treatment, examination of patient, obtaining history from patient or surrogate, ordering and performing treatments and interventions, ordering and review of laboratory studies, ordering and review of radiographic studies, pulse oximetry and re-evaluation of patient's condition.   ____________________________________________   INITIAL IMPRESSION / ASSESSMENT AND PLAN / ED COURSE  Pertinent labs & imaging results that were available during my care of the patient were reviewed by me and considered in my medical decision making (see chart for details).  Patient received multiple DuoNeb's on presentation to the emergency department as well as deep suctioning of her tracheostomy site with copious thick secretions obtained. Patient meets criteria for sepsis and a such given concern for possible hospital-acquired pneumonia, versus tracheitis IV vancomycin and Levaquin administered. On reevaluation patient admits to some improvement  in respiratory  status  ____________________________________________   FINAL CLINICAL IMPRESSION(S) / ED DIAGNOSES  Final diagnoses:  Shortness of breath  Sepsis, due to unspecified organism [facility])      Gregor Hams, MD 08/13/15 2135

## 2015-08-13 NOTE — H&P (Signed)
[facility] NAME: Sherry Ramsey    MR#:  [id number]  DATE OF BIRTH:  [birth year]  DATE OF ADMISSION:  08/13/2015  PRIMARY CARE PHYSICIAN: Marden Noble, MD   REQUESTING/REFERRING PHYSICIAN: Dr Marjean Donna  CHIEF COMPLAINT:   Chief Complaint  Patient presents with  . Shortness of Breath    HISTORY OF PRESENT ILLNESS:  Sherry Ramsey  is a 35 y.o. female with a known history of COPD and congestive heart failure. Patient presented to the ER with respiratory distress and found to be hypoxic. Patient was suctioned and put on trach collar. Patient states that she's been short of breath and coughing up a lot of yellow-green sputum. She's been having fever and chills on and off. In the ER she was found to be tachycardic, tachypnea And have an elevated white count. Hospitalist services were contacted for further evaluation.  PAST MEDICAL HISTORY:   Past Medical History  Diagnosis Date  . Diabetes (Sherry Ramsey)   . HTN (hypertension)   . HLD (hyperlipidemia)   . Chronic pain   . CHF (congestive heart failure) (Sherry Ramsey)   . MDD (major depressive disorder) ([place])   . Anxiety   . Blood transfusion without reported diagnosis   . Cancer [facility])     Uterine    PAST SURGICAL HISTORY:   Past Surgical History  Procedure Laterality Date  . Tracheostomy      SOCIAL HISTORY:   Social History  Substance Use Topics  . Smoking status: Former Research scientist (life sciences)  . Smokeless tobacco: Not on file  . Alcohol Use: No    FAMILY HISTORY:   Family History  Problem Relation Age of Onset  . Diabetes Neg Hx   . Heart failure Neg Hx   . Hypertension Mother     DRUG ALLERGIES:   Allergies  Allergen Reactions  . Ibuprofen Other (See Comments)    Reaction: unknown    REVIEW OF SYSTEMS:  CONSTITUTIONAL: On off fever and chills, positive for fatigue or weakness.  EYES: No blurred or double vision. Wears glasses EARS, NOSE, AND THROAT: No tinnitus or  ear pain. Positive for sore throat and runny nose. Dysphagia to liquids and solids. RESPIRATORY: Positive for cough, shortness of breath, wheezing. No hemoptysis.  CARDIOVASCULAR: Positive for chest pain and edema.  GASTROINTESTINAL: No nausea, vomiting, diarrhea or abdominal pain. No blood in bowel movements GENITOURINARY: No dysuria, hematuria. Positive for vaginal bleeding for 4 weeks ENDOCRINE: No polyuria, nocturia,  HEMATOLOGY: History of anemia, no easy bruising or bleeding SKIN: No rash or lesion. MUSCULOSKELETAL: Positive for joint pains.   NEUROLOGIC: No tingling, numbness, weakness.  PSYCHIATRY: History of anxiety and depression.   MEDICATIONS AT HOME:   Prior to Admission medications   Medication Sig Start Date End Date Taking? Authorizing Provider  atorvastatin (LIPITOR) 10 MG tablet Take 10 mg by mouth at bedtime.   Yes Historical Provider, MD  butalbital-acetaminophen-caffeine (FIORICET, ESGIC) 50-325-40 MG tablet Take 1 tablet by mouth every 4 (four) hours as needed for headache.   Yes Historical Provider, MD  carvedilol (COREG) 6.25 MG tablet Take 6.25 mg by mouth 2 (two) times daily with a meal.   Yes Historical Provider, MD  clonazePAM (KLONOPIN) 0.5 MG tablet Take 1 mg by mouth at bedtime.   Yes Historical Provider, MD  diphenhydrAMINE (BENADRYL) 25 MG tablet Take 25 mg by mouth every 6 (six) hours as needed for itching.   Yes Historical Provider, MD  furosemide (LASIX) 40 MG tablet Take 40 mg by mouth.   Yes Historical Provider, MD  guaiFENesin (MUCINEX) 600 MG 12 hr tablet Take 600 mg by mouth 2 (two) times daily as needed for cough.   Yes Historical Provider, MD  HYDROmorphone (DILAUDID) 2 MG tablet Take 2 mg by mouth every 4 (four) hours as needed for severe pain. 07/25/15 08/15/15 Yes Historical Provider, MD  lisinopril (PRINIVIL,ZESTRIL) 5 MG tablet Take 5 mg by mouth daily.   Yes Historical Provider, MD  medroxyPROGESTERone (PROVERA) 10 MG tablet Take 10 mg by mouth  daily.   Yes Historical Provider, MD  Melatonin 5 MG TABS Take 10 mg by mouth at bedtime.   Yes Historical Provider, MD  Multiple Vitamin (MULTIVITAMIN WITH MINERALS) TABS tablet Take 1 tablet by mouth daily.   Yes Historical Provider, MD  senna (SENOKOT) 8.6 MG tablet Take 2 tablets by mouth 2 (two) times daily.   Yes Historical Provider, MD  topiramate (TOPAMAX) 25 MG tablet Take 25 mg by mouth 2 (two) times daily.   Yes Historical Provider, MD  traZODone (DESYREL) 50 MG tablet Take 200 mg by mouth at bedtime.   Yes Historical Provider, MD  sertraline (ZOLOFT) 50 MG tablet Take 50 mg by mouth daily. 07/29/15 08/04/15  Historical Provider, MD      VITAL SIGNS:  Blood pressure 121/106, pulse 108, temperature 98.8 F (37.1 C), temperature source Oral, resp. rate 35, height 5\' 4"  (1.626 m), weight 245.85 kg (542 lb), last menstrual period 07/27/2015, SpO2 94 %.  PHYSICAL EXAMINATION:  GENERAL:  35 y.o.-year-old patient lying in the bed with coughing with any movement.  EYES: Pupils equal, round, reactive to light and accommodation. No scleral icterus. Extraocular muscles intact.  HEENT: Head atraumatic, normocephalic. Oropharynx and nasopharynx clear.  NECK:  Supple, no jugular venous distention. No thyroid enlargement, no tenderness.  LUNGS: Decreased breath sounds bilaterally, slight basilar wheezing, no rales,rhonchi or crepitation. No use of accessory muscles of respiration.  CARDIOVASCULAR: S1, S2 tachycardic. No murmurs, rubs, or gallops.  ABDOMEN: Soft, nontender, nondistended. Bowel sounds present. No organomegaly or mass.  EXTREMITIES: 3+ pedal edema, no cyanosis, or clubbing.  NEUROLOGIC: Cranial nerves II through XII are intact. Sensation intact. Gait not checked.  PSYCHIATRIC: The patient is alert and oriented x 3.  SKIN: No rash, lesion, or ulcer.   LABORATORY PANEL:   CBC  Recent Labs Lab 08/13/15 0627  WBC 21.2*  HGB 8.0*  HCT 26.4*  PLT 487*    ------------------------------------------------------------------------------------------------------------------  Chemistries   Recent Labs Lab 08/13/15 0627  NA 135  K 4.1  CL 97*  CO2 33*  GLUCOSE 131*  BUN 25*  CREATININE 0.93  CALCIUM 8.8*   ------------------------------------------------------------------------------------------------------------------  Cardiac Enzymes  Recent Labs Lab 08/13/15 0627  TROPONINI 0.04*   ------------------------------------------------------------------------------------------------------------------  RADIOLOGY:  Dg Chest Port 1 View  08/13/2015  CLINICAL DATA:  Shortness of breath. History of CHF and hypertension. EXAM: PORTABLE CHEST 1 VIEW COMPARISON:  08/03/2015 FINDINGS: Shallow inspiration. Cardiac enlargement with prominent pulmonary vascularity. Perihilar nodular changes likely represent prominent vessels on end. No definite edema. No blunting of costophrenic angles. No pneumothorax. Tracheostomy. IMPRESSION: Cardiac enlargement with pulmonary vascular congestion. No definite edema or consolidation. Electronically Signed   By: Lucienne Capers M.D.   On: 08/13/2015 06:57    EKG:   Ordered by me  IMPRESSION AND PLAN:   1. Acute respiratory failure. Could be secondary to infection versus mucus plugging. Patient currently on trach collar. I  will admit to the CCU stepdown and get pulmonary critical care consultation. Continue oxygen supplementation. 2. Systemic inflammatory response syndrome. Unclear if underlying pneumonia at this time. Start triple antibiotics since came from a nursing home. Monitor temperature curve and white count. Send off sputum culture. Add Solu-Medrol for COPD exacerbation. 3. History of diastolic congestive heart failure. We'll give IV Lasix while here. Continue Coreg and lisinopril 4. Obesity and likely sleep apnea. Weight loss needed 5. Borderline elevated troponin. Order EKG. This is likely demand  ischemia from acute respiratory failure. 6. Anxiety depression continue psychiatric medications 7. Vaginal bleeding going on for 4 weeks on Provera we'll get gynecology consultation 8. Anemia. Send off iron studies 9. Hyperlipidemia unspecified continue statin 10. Accelerated hypertension on presentation likely secondary to respiratory distress. Continue usual oral medications 11. History of dysphagia - swallow evaluation    All the records are reviewed and case discussed with ED provider. Management plans discussed with the patient, family and they are in agreement.  CODE STATUS: Full code  TOTAL TIME TAKING CARE OF THIS PATIENT: 55 minutes, patient needs close monitoring in the CCU stepdown at this point, high risk for decompensation. Patient critically ill.   Loletha Grayer M.D on 08/13/2015 at 8:05 AM  Between 7am to 6pm - Pager - [phone]  After 6pm call admission pager [phone]  [facility] Office  [phone]  CC: Primary care physician; Marden Noble, MD

## 2015-08-13 NOTE — ED Notes (Signed)
BP low upon my arrival to room - recheck showed improvement   Pt with eyes closed but awakens with verbal stimulation   Trop 0.04

## 2015-08-13 NOTE — Progress Notes (Signed)
PATIENT MOUTHING THAT SHE FEELS THAT HER CHEST IS TIGHT AND HER FACE FEELS HEAVY. SHE JUST COMPLETED EATING HER LUNCH MEAL WITH NO COMPLICATIONS. OXYGEN LEVEL 95% WITH SHALLOW BREATHING. SHE DID REMOVE TRACH COLLAR AND PLACED [place] WHILE SHE WAS EATING. NOTIFIED DR MUNGAL AS HE IS ON THE UNIT. HE WILL RE-EVALUATE PATIENT.

## 2015-08-13 NOTE — ED Notes (Signed)
Per EMS pt has been feeling short of breath and sick x 2 weeks.  Pt was in the 80s on RA per EMS.  Pt was suctioned by EMS and given a duoneb en route.  EMS states she had some pink/bloody sputum in suction.

## 2015-08-13 NOTE — Consult Note (Addendum)
PULMONARY / CRITICAL CARE MEDICINE   Name: Sherry Ramsey MRN: [id number] DOB: 1980-11-04    ADMISSION DATE:  08/13/2015 CONSULTATION DATE:  08/12/15 REFERRING MD :  Dr. Leslye Peer   CHIEF COMPLAINT:   Reason for consult  SOB  HISTORY OF PRESENT ILLNESS    34 year old female with a known history of COPD, just of heart failure, presented to the ER with main complaint of respiratory distress/shortness of breath/fever/not feeling well for the past 2 weeks. She has a chronic trach secondary to OSA/COPD/CHF/morbid obesity. She states over the last 2 weeks she's been having moderate secretions from her trach site, thick yellow to green sputum production. She admits to intermittent fevers and chills. In the ER she was found to be tachycardic, tachypneic, and she is without elevated white count. Given chronic trach and thick secretions, pulmonary was consulted for further recommendations.     SIGNIFICANT EVENTS     PAST MEDICAL HISTORY    :  Past Medical History  Diagnosis Date  . Diabetes ([place])   . HTN (hypertension)   . HLD (hyperlipidemia)   . Chronic pain   . CHF (congestive heart failure) ([place])   . MDD (major depressive disorder) ([place])   . Anxiety   . Blood transfusion without reported diagnosis   . Cancer [facility])     Uterine   Past Surgical History  Procedure Laterality Date  . Tracheostomy     Prior to Admission medications   Medication Sig Start Date End Date Taking? Authorizing Provider  atorvastatin (LIPITOR) 10 MG tablet Take 10 mg by mouth at bedtime.   Yes Historical Provider, MD  butalbital-acetaminophen-caffeine (FIORICET, ESGIC) 50-325-40 MG tablet Take 1 tablet by mouth every 4 (four) hours as needed for headache.   Yes Historical Provider, MD  carvedilol (COREG) 6.25 MG tablet Take 6.25 mg by mouth 2 (two) times daily with a meal.   Yes Historical Provider, MD  clonazePAM (KLONOPIN) 0.5 MG tablet Take 1 mg by mouth at bedtime.   Yes Historical Provider, MD   diphenhydrAMINE (BENADRYL) 25 MG tablet Take 25 mg by mouth every 6 (six) hours as needed for itching.   Yes Historical Provider, MD  furosemide (LASIX) 40 MG tablet Take 40 mg by mouth.   Yes Historical Provider, MD  guaiFENesin (MUCINEX) 600 MG 12 hr tablet Take 600 mg by mouth 2 (two) times daily as needed for cough.   Yes Historical Provider, MD  HYDROmorphone (DILAUDID) 2 MG tablet Take 2 mg by mouth every 4 (four) hours as needed for severe pain. 07/25/15 08/15/15 Yes Historical Provider, MD  lisinopril (PRINIVIL,ZESTRIL) 5 MG tablet Take 5 mg by mouth daily.   Yes Historical Provider, MD  medroxyPROGESTERone (PROVERA) 10 MG tablet Take 10 mg by mouth daily.   Yes Historical Provider, MD  Melatonin 5 MG TABS Take 10 mg by mouth at bedtime.   Yes Historical Provider, MD  Multiple Vitamin (MULTIVITAMIN WITH MINERALS) TABS tablet Take 1 tablet by mouth daily.   Yes Historical Provider, MD  senna (SENOKOT) 8.6 MG tablet Take 2 tablets by mouth 2 (two) times daily.   Yes Historical Provider, MD  topiramate (TOPAMAX) 25 MG tablet Take 25 mg by mouth 2 (two) times daily.   Yes Historical Provider, MD  traZODone (DESYREL) 50 MG tablet Take 200 mg by mouth at bedtime.   Yes Historical Provider, MD  sertraline (ZOLOFT) 50 MG tablet Take 50 mg by mouth daily. 07/29/15 08/04/15  Historical Provider, MD   Allergies  Allergen Reactions  . Ibuprofen Other (See Comments)    Reaction: unknown     FAMILY HISTORY   Family History  Problem Relation Age of Onset  . Diabetes Neg Hx   . Heart failure Neg Hx   . Hypertension Mother       SOCIAL HISTORY    reports that she has quit smoking. She does not have any smokeless tobacco history on file. She reports that she does not drink alcohol or use illicit drugs.  Review of Systems  Constitutional: Positive for fever and chills.  HENT: Negative for hearing loss.   Eyes: Negative for blurred vision.  Respiratory: Positive for cough, hemoptysis, sputum  production, shortness of breath and wheezing.   Cardiovascular: Positive for palpitations. Negative for chest pain.  Gastrointestinal: Positive for diarrhea. Negative for heartburn and nausea.  Neurological: Positive for weakness. Negative for dizziness and headaches.  Psychiatric/Behavioral: Negative for depression.      VITAL SIGNS    Temp:  [98.8 F (37.1 C)] 98.8 F (37.1 C) (04/19 0620) Pulse Rate:  [101-113] 103 (04/19 0900) Resp:  [22-35] 27 (04/19 0900) BP: (55-142)/(37-107) 134/68 mmHg (04/19 0900) SpO2:  [85 %-100 %] 98 % (04/19 1109) FiO2 (%):  [35 %-40 %] 35 % (04/19 1109) Weight:  [542 lb (245.85 kg)] 542 lb (245.85 kg) (04/19 0625) HEMODYNAMICS:   VENTILATOR SETTINGS: Vent Mode:  [-]  FiO2 (%):  [35 %-40 %] 35 % INTAKE / OUTPUT: No intake or output data in the 24 hours ending 08/13/15 1149     PHYSICAL EXAM   Physical Exam  Constitutional: She is oriented to person, place, and time. She appears well-developed and well-nourished.  Morbidly obese female laying in bed in no acute respiratory distress, currently on trach collar  HENT:  Head: Normocephalic and atraumatic.  Right Ear: External ear normal.  Left Ear: External ear normal.  Nose: Nose normal.  Mouth/Throat: Oropharynx is clear and moist.  Neck: Normal range of motion. Neck supple.  Trach collar in place, no erythema, no discharge. Mild pink blood-tinged sputum production on suctioning  Cardiovascular: Normal rate, regular rhythm and intact distal pulses.   Murmur heard. Pulmonary/Chest: No respiratory distress. She has no wheezes. She has no rales. She exhibits no tenderness.  Echo to auscultate breath sounds due to chest wall size. Mild decreased breath sounds at the bases, shallow breath sounds throughout, no significant wheezing or crackles.  Abdominal: Soft. Bowel sounds are normal.  Musculoskeletal: Normal range of motion.  Neurological: She is alert and oriented to person, place, and  time.  Skin: Skin is warm and dry.  Psychiatric: She has a normal mood and affect.  Nursing note and vitals reviewed.      LABS   LABS:  CBC  Recent Labs Lab 08/07/15 1514 08/13/15 0627  WBC 13.9* 21.2*  HGB 9.0* 8.0*  HCT 29.4* 26.4*  PLT 474* 487*   Coag's  Recent Labs Lab 08/07/15 1514  APTT 31  INR 1.06   BMET  Recent Labs Lab 08/07/15 1514 08/13/15 0627  NA 138 135  K 3.9 4.1  CL 99* 97*  CO2 31 33*  BUN 16 25*  CREATININE 0.84 0.93  GLUCOSE 102* 131*   Electrolytes  Recent Labs Lab 08/07/15 1514 08/13/15 0627  CALCIUM 9.1 8.8*   Sepsis Markers  Recent Labs Lab 08/13/15 0838  LATICACIDVEN 0.6   ABG No results for input(s): PHART, PCO2ART, PO2ART in the last 168 hours. Liver Enzymes No results for  input(s): AST, ALT, ALKPHOS, BILITOT, ALBUMIN in the last 168 hours. Cardiac Enzymes  Recent Labs Lab 08/07/15 1514 08/13/15 0627  TROPONINI 0.04* 0.04*   Glucose  Recent Labs Lab 08/13/15 1056  GLUCAP 106*     Recent Results (from the past 240 hour(s))  MRSA PCR Screening     Status: Abnormal   Collection Time: 08/04/15  6:34 AM  Result Value Ref Range Status   MRSA by PCR POSITIVE (A) NEGATIVE Final    Comment:        The GeneXpert MRSA Assay (FDA approved for NASAL specimens only), is one component of a comprehensive MRSA colonization surveillance program. It is not intended to diagnose MRSA infection nor to guide or monitor treatment for MRSA infections. CRITICAL RESULT CALLED TO, READ BACK BY AND VERIFIED WITH: LEAH LEE ON 08/04/15 AT [id number] BY [facility]      Current facility-administered medications:  .  acetaminophen (TYLENOL) tablet 650 mg, 650 mg, Oral, Q6H PRN **OR** acetaminophen (TYLENOL) suppository 650 mg, 650 mg, Rectal, Q6H PRN, Loletha Grayer, MD .  albuterol (PROVENTIL) (2.5 MG/3ML) 0.083% nebulizer solution 2.5 mg, 2.5 mg, Nebulization, Q2H PRN, Jeralynn Vaquera, MD .  atorvastatin (LIPITOR) tablet 10 mg,  10 mg, Oral, QHS, Loletha Grayer, MD .  budesonide (PULMICORT) nebulizer solution 0.25 mg, 0.25 mg, Nebulization, BID, Loletha Grayer, MD, 0.25 mg at 08/13/15 1115 .  butalbital-acetaminophen-caffeine (FIORICET, ESGIC) 50-325-40 MG per tablet 1 tablet, 1 tablet, Oral, Q4H PRN, Loletha Grayer, MD .  carvedilol (COREG) tablet 6.25 mg, 6.25 mg, Oral, BID WC, Loletha Grayer, MD .  clonazePAM (KLONOPIN) tablet 1 mg, 1 mg, Oral, QHS, Loletha Grayer, MD .  diphenhydrAMINE (BENADRYL) tablet 25 mg, 25 mg, Oral, Q6H PRN, Loletha Grayer, MD .  furosemide (LASIX) injection 40 mg, 40 mg, Intravenous, Daily, Loletha Grayer, MD .  guaiFENesin (MUCINEX) 12 hr tablet 600 mg, 600 mg, Oral, BID PRN, Loletha Grayer, MD .  HYDROmorphone (DILAUDID) tablet 2 mg, 2 mg, Oral, Q4H PRN, Loletha Grayer, MD, 2 mg at 08/13/15 1118 .  ipratropium-albuterol (DUONEB) 0.5-2.5 (3) MG/3ML nebulizer solution 3 mL, 3 mL, Nebulization, Q6H, Loletha Grayer, MD, 3 mL at 08/13/15 1115 .  [START ON 08/14/2015] levofloxacin (LEVAQUIN) IVPB 750 mg, 750 mg, Intravenous, Q24H, Loletha Grayer, MD .  lisinopril (PRINIVIL,ZESTRIL) tablet 5 mg, 5 mg, Oral, Daily, Loletha Grayer, MD .  medroxyPROGESTERone (PROVERA) tablet 10 mg, 10 mg, Oral, Daily, Loletha Grayer, MD .  methylPREDNISolone sodium succinate (SOLU-MEDROL) 125 mg/2 mL injection 60 mg, 60 mg, Intravenous, Daily, Loletha Grayer, MD, 60 mg at 08/13/15 0942 .  multivitamin with minerals tablet 1 tablet, 1 tablet, Oral, Daily, Loletha Grayer, MD .  piperacillin-tazobactam (ZOSYN) IVPB 4.5 g, 4.5 g, Intravenous, Q8H, Loletha Grayer, MD .  senna (SENOKOT) tablet 17.2 mg, 2 tablet, Oral, BID, Loletha Grayer, MD .  sertraline (ZOLOFT) tablet 50 mg, 50 mg, Oral, Daily, Loletha Grayer, MD .  sodium chloride flush (NS) 0.9 % injection 3 mL, 3 mL, Intravenous, Q12H, Loletha Grayer, MD .  topiramate (TOPAMAX) tablet 25 mg, 25 mg, Oral, BID, Loletha Grayer, MD .  traZODone  (DESYREL) tablet 200 mg, 200 mg, Oral, QHS, Loletha Grayer, MD .  vancomycin (VANCOCIN) 1,500 mg in sodium chloride 0.9 % 500 mL IVPB, 1,500 mg, Intravenous, Q8H, Loletha Grayer, MD  IMAGING    Dg Chest Port 1 View  08/13/2015  CLINICAL DATA:  Shortness of breath. History of CHF and hypertension. EXAM: PORTABLE CHEST 1 VIEW COMPARISON:  08/03/2015 FINDINGS:  Shallow inspiration. Cardiac enlargement with prominent pulmonary vascularity. Perihilar nodular changes likely represent prominent vessels on end. No definite edema. No blunting of costophrenic angles. No pneumothorax. Tracheostomy. IMPRESSION: Cardiac enlargement with pulmonary vascular congestion. No definite edema or consolidation. Electronically Signed   By: Lucienne Capers M.D.   On: 08/13/2015 06:57      Indwelling Urinary Catheter continued, requirement due to   Reason to continue Indwelling Urinary Catheter for strict Intake/Output monitoring for hemodynamic instability   Central Line continued, requirement due to   Reason to continue [employer] Monitoring of central venous pressure or other hemodynamic parameters   Ventilator continued, requirement due to, resp failure    Ventilator Sedation RASS 0 to -2   Cultures: BCx2  UC  Sputum 4/19  Antibiotics: Levaquin 4/19>> Zosyn 4/19>>  Lines:   ASSESSMENT/PLAN  35 year old female past medical history of COPD, OSA status post tracheostomy, morbid obesity, congestive heart failure (grade 1 diastolic) our cardiomyopathy, seen in consultation for shortness of breath  A: Dyspnea Possible viral syndrome/bronchitis Chronic tracheostomy Morbid obesity Cardiomegaly Congestive heart failure  Plan: -Oxygenation and ventilation adequate at this time, she is on trach collar and saturating greater than 94% during my evaluation -She does have diminished breath sounds, given her clinical history, patient may have a viral syndrome/bronchitis/tracheitis. -Check viral  panel, respiratory panel -Follow-up tracheal aspirate -Continue her current antibiotics -Consider checking blood cultures since white cell count is elevated to 21,000 -Continue gentle IV hydration -Patient does not require nighttime ventilator or ventilator at this time  I have personally obtained a history, examined the patient, evaluated laboratory and imaging results, formulated the assessment and plan and placed orders.  Pulmoanry Care Time devoted to patient care services described in this note is 45 minutes.   Vilinda Boehringer, MD [facility] Pager [phone] (please enter 7-digits) On Call Pager [phone] (please enter 7-digits)     08/13/2015, 11:49 AM  Note: This note was prepared with Dragon dictation along with smaller phrase technology. Any transcriptional errors that result from this process are unintentional.

## 2015-08-13 NOTE — ED Notes (Signed)
Report called to Sandra RN in ICU

## 2015-08-13 NOTE — Therapy (Signed)
Patient found with PMV in place and [place] at 5 lpm with SpO2 86%, RR 36. Patient informed that she needs to wear the t-collar with the PMV because she is still breathing in at the trach opening and only exhaling out of her mouth. Patient then took off the PMV and put the t-collar back in place. SpO2 increased to 99%. Scheduled neb tx given. Via t-collar

## 2015-08-13 NOTE — Progress Notes (Signed)
Pt refuses to let RT suction. RN aware.

## 2015-08-13 NOTE — Progress Notes (Signed)
Called by RN to place pt back on vent due to desats at 85%, pt also lethargic. Tol well at this time.

## 2015-08-13 NOTE — Therapy (Signed)
Patient noted to be increasingly SOB especially when asleep, diaphramatic respirations noted. No change after neb tx. SpO2 drops to 90% while asleep. P.A. Currently at bedside to evaluate patient.

## 2015-08-13 NOTE — NC FL2 (Signed)
[place] LEVEL OF CARE SCREENING TOOL     IDENTIFICATION  Patient Name: Sherry Ramsey Birthdate: Mar 26, 1981 Sex: female Admission Date (Current Location): 08/13/2015  [place] and [place] Number:  Engineering geologist and Address:  [facility], [address], [place], [place] [address]      Provider Number: [id number]  Attending Physician Name and Address:  Loletha Grayer, MD  Relative Name and Phone Number:       Current Level of Care: Hospital Recommended Level of Care: [place] Prior Approval Number:    Date Approved/Denied:   PASRR Number: [id number]  Discharge Plan: SNF    Current Diagnoses: Patient Active Problem List   Diagnosis Date Noted  . Acute respiratory failure with hypoxia ([place]) 08/13/2015  . Type 2 diabetes mellitus ([place]) 08/03/2015  . HTN (hypertension) 08/03/2015  . HLD (hyperlipidemia) 08/03/2015  . Chronic systolic CHF (congestive heart failure) ([place]) 08/03/2015  . Elevated troponin 08/03/2015  . Symptomatic anemia 08/03/2015    Orientation RESPIRATION BLADDER Height & Weight     Self, Time, Situation, Place  Tracheostomy, O2 Continent Weight: (!) 542 lb (245.85 kg) Height:  5\' 4"  (162.6 cm)  BEHAVIORAL SYMPTOMS/MOOD NEUROLOGICAL BOWEL NUTRITION STATUS  Other (Comment) (none)   Continent Diet (low sodium)  AMBULATORY STATUS COMMUNICATION OF NEEDS Skin   Extensive Assist Verbally Normal                       Personal Care Assistance Level of Assistance  Bathing, Feeding, Dressing Bathing Assistance: Maximum assistance Feeding assistance: Maximum assistance Dressing Assistance: Maximum assistance     Functional Limitations Info    Sight Info: Adequate Hearing Info: Adequate Speech Info: Adequate    SPECIAL CARE FACTORS FREQUENCY                       Contractures Contractures Info: Not present    Additional Factors Info  Code Status, Allergies Code Status Info:  Full Code Allergies Info: Ibuprofen Psychotropic Info: Klonopin, Trazadone, zoloft  Insulin Sliding Scale Info: none       Current Medications (08/13/2015):  This is the current hospital active medication list Current Facility-Administered Medications  Medication Dose Route Frequency Provider Last Rate Last Dose  . acetaminophen (TYLENOL) tablet 650 mg  650 mg Oral Q6H PRN Loletha Grayer, MD       Or  . acetaminophen (TYLENOL) suppository 650 mg  650 mg Rectal Q6H PRN Loletha Grayer, MD      . albuterol (PROVENTIL) (2.5 MG/3ML) 0.083% nebulizer solution 2.5 mg  2.5 mg Nebulization Q2H PRN Vishal Mungal, MD      . atorvastatin (LIPITOR) tablet 10 mg  10 mg Oral QHS Loletha Grayer, MD      . budesonide (PULMICORT) nebulizer solution 0.25 mg  0.25 mg Nebulization BID Loletha Grayer, MD   0.25 mg at 08/13/15 1115  . butalbital-acetaminophen-caffeine (FIORICET, ESGIC) 50-325-40 MG per tablet 1 tablet  1 tablet Oral Q4H PRN Loletha Grayer, MD      . carvedilol (COREG) tablet 6.25 mg  6.25 mg Oral BID WC Loletha Grayer, MD      . clonazePAM [facility]) tablet 1 mg  1 mg Oral QHS Loletha Grayer, MD      . diphenhydrAMINE (BENADRYL) tablet 25 mg  25 mg Oral Q6H PRN Loletha Grayer, MD      . furosemide (LASIX) injection 40 mg  40 mg Intravenous Daily Loletha Grayer, MD      .  guaiFENesin (MUCINEX) 12 hr tablet 600 mg  600 mg Oral BID PRN Loletha Grayer, MD      . HYDROmorphone (DILAUDID) tablet 2 mg  2 mg Oral Q4H PRN Loletha Grayer, MD   2 mg at 08/13/15 1118  . ipratropium-albuterol (DUONEB) 0.5-2.5 (3) MG/3ML nebulizer solution 3 mL  3 mL Nebulization Q6H Loletha Grayer, MD   3 mL at 08/13/15 1115  . [START ON 08/14/2015] levofloxacin (LEVAQUIN) IVPB 750 mg  750 mg Intravenous Q24H Loletha Grayer, MD      . lisinopril (PRINIVIL,ZESTRIL) tablet 5 mg  5 mg Oral Daily Loletha Grayer, MD      . medroxyPROGESTERone (PROVERA) tablet 10 mg  10 mg Oral Daily Loletha Grayer, MD      .  methylPREDNISolone sodium succinate (SOLU-MEDROL) 125 mg/2 mL injection 60 mg  60 mg Intravenous Daily Loletha Grayer, MD   60 mg at 08/13/15 0942  . multivitamin with minerals tablet 1 tablet  1 tablet Oral Daily Loletha Grayer, MD      . piperacillin-tazobactam (ZOSYN) IVPB 4.5 g  4.5 g Intravenous Q8H Loletha Grayer, MD      . [facility]) tablet 17.2 mg  2 tablet Oral BID Loletha Grayer, MD      . sertraline (ZOLOFT) tablet 50 mg  50 mg Oral Daily Loletha Grayer, MD      . sodium chloride flush (NS) 0.9 % injection 3 mL  3 mL Intravenous Q12H Loletha Grayer, MD      . topiramate (TOPAMAX) tablet 25 mg  25 mg Oral BID Loletha Grayer, MD      . traZODone (DESYREL) tablet 200 mg  200 mg Oral QHS Loletha Grayer, MD      . vancomycin (VANCOCIN) 1,500 mg in sodium chloride 0.9 % 500 mL IVPB  1,500 mg Intravenous Q8H Loletha Grayer, MD         Discharge Medications: Please see discharge summary for a list of discharge medications.  Relevant Imaging Results:  Relevant Lab Results:   Additional Information SSN: [id number]  [place]illy [place], LCSW

## 2015-08-13 NOTE — Therapy (Signed)
Patient increasingly difficult to wake. Discussed with M.D. Order received for vent during naps and HS. Unable to wake patient to explain procedure. Placed on vent at ordered settings. Within 10 min on vent, patient woke up and was agitated about having cuff inflated and vent. Explained that she was not breathing enough and her PCO2 was dangerously high. ETCO2 on vent 62. Patient eventually calmed and is now asleep on vent, respiratory effort significantly improved. Patient wakes easily

## 2015-08-13 NOTE — Progress Notes (Signed)
Patient refused to wear trach collar while eating requested [place].... Informed respitory

## 2015-08-13 NOTE — Progress Notes (Signed)
Pharmacy Antibiotic Note  Sherry Ramsey is a 35 y.o. female admitted on 08/13/2015 with pneumonia.  Pharmacy has been consulted for vancomycin, Zosyn, and Levaquin dosing.  Plan: Vancomycin 1750 mg iv once then Vancomycin 1500 IV every 8 hours with stacked dosing and a trough with the 5th total dose.  Goal trough 15-20 mcg/mL.  Zosyn 4.5 g IV q 8 h EI.   Levaquin 750 mg iv q 24 hours.   Height: 5\' 4"  (162.6 cm) Weight: (!) 542 lb (245.85 kg) IBW/kg (Calculated) : 54.7  Temp (24hrs), Avg:98.8 F (37.1 C), Min:98.8 F (37.1 C), Max:98.8 F (37.1 C)   Recent Labs Lab 08/07/15 1514 08/13/15 0627 08/13/15 0838  WBC 13.9* 21.2*  --   CREATININE 0.84 0.93  --   LATICACIDVEN  --   --  0.6    Estimated Creatinine Clearance: 173.2 mL/min (by C-G formula based on Cr of 0.93).    Allergies  Allergen Reactions  . Ibuprofen Other (See Comments)    Reaction: unknown    Antimicrobials this admission: Levaquin 4/19 >>  Vancomycin  4/19 >>  Zosyn 4/19 >>  Dose adjustments this admission:   Microbiology results: Sputum: pending  MRSA PCR: pending  Thank you for allowing pharmacy to be a part of this patient's care.  Ulice Dash D 08/13/2015 11:41 AM

## 2015-08-13 NOTE — Evaluation (Signed)
Clinical/Bedside Swallow Evaluation Patient Details  Name: Sherry Ramsey MRN: [id number] Date of Birth: July 24, 1980  Today's Date: 08/13/2015 Time: SLP Start Time (ACUTE ONLY): 1215 SLP Stop Time (ACUTE ONLY): 1315 SLP Time Calculation (min) (ACUTE ONLY): 60 min  Past Medical History:  Past Medical History  Diagnosis Date  . Diabetes ([place])   . HTN (hypertension)   . HLD (hyperlipidemia)   . Chronic pain   . CHF (congestive heart failure) ([place])   . MDD (major depressive disorder) ([place])   . Anxiety   . Blood transfusion without reported diagnosis   . Cancer [facility])     Uterine   Past Surgical History:  Past Surgical History  Procedure Laterality Date  . Tracheostomy     HPI:  Pt is a 35 y.o. female with a known history of Obesity, HTN, DM, Anxiety, MDD(major depressive dis.), CHF, and COPD. She received a tracheostomy several weeks ago  Patient presented to the ER with respiratory distress and found to be hypoxic. Patient was suctioned and put on trach collar. Patient states that she's been short of breath and coughing up a lot of yellow-green sputum. She's been having fever and chills on and off. In the ER she was found to be tachycardic, tachypnea. Pt has a chronic trach secondary to OSA/COPD/CHF/morbid obesity. She states over the last 2 weeks she's been having moderate secretions from her trach site, thick yellow to green sputum production. She manages her own trach care at times at the NH per report. Pt stated she wears her PMV when eating meals; often wears it "24/7". Given chronic trach and thick secretions, pulmonary is consulted for further recommendations. Pt is verbally conversive w/ PMV placed; A/O x3; follows instructions. Pt denied any trouble swallowing stating she ate a regular diet at the NH. Noted increased WOB/effort when she became emotional when talking w/ SLP about her medical issues including not being able to sleep "for 3 days straight" and her anxiety (will pass these  concerns onto the nuse.)   Assessment / Plan / Recommendation Clinical Impression  Pt appears to present w/ adequate toleration of oral intake when wearing PMV (speaking valve) w/ O2 support, and following general aspiration precautions to include eating slowly and using small, single bites/sips. Pt tolerated PMV placement/wear (baseline use for pt w/ cuff deflated) w/ O2 sats in mid-upper 90's, HR 99, RR 18-29 (fluctuated w/ exertion). Pt fed self po's of thin liquids and soft solid/puree foods w/ no overt s/s of aspiration noted; no decline in O2 sats or RR and vocal quality was clear b/t trials. Pt exhibited min. increased respiratory effort and increased RR w/ the exertion of taking multiple bites/sips w/out a rest break. Pt was immediately educated on using rest breaks b/t every 3-5 po trials and to slow down when eating/drinking to allow for Esophageal clearing - to not fill her mouth too full as well. Pt followed these instructions at the time. No oral phase deficits were noted w/ any po trials; appropriate oral management and clearing noted. Pt fed self w/ min. tray setup. Pt appears appropriate to continue w/ current regular diet as ordered by MD - rec'd cutting foods into SMALL bite sizes and eating SLOWLY; general aspiration precautions w/ meals and swallowing medicines. Recommend wearing PMV when eating/drinking or any oral intake(NSG to monitor use d/t increased tracheal secretions); sitting upright for any oral intake. ST will be available for any further education as needed while admitted. NSG updated.  Aspiration Risk   (reduced following general aspiration precautions)    Diet Recommendation  regular(meats cut and moist), thin liquids; general aspiration precautions and MUST wear PMV during any oral intake - NSG to monitor PMV wear/care in general, and remind pt to remove PMV if sleeping.   Medication Administration: Whole meds with liquid (as tolerates or w/ Puree for easier  swallowing/clearing)    Other  Recommendations Recommended Consults:  (Dietician f/u) Oral Care Recommendations: Oral care BID;Staff/trained caregiver to provide oral care   Follow up Recommendations  None    Frequency and Duration            Prognosis Prognosis for Safe Diet Advancement: Good      Swallow Study   General Date of Onset: 08/13/15 HPI: Pt is a 35 y.o. female with a known history of Obesity, HTN, DM, Anxiety, MDD(major depressive dis.), CHF, and COPD. She received a tracheostomy several weeks ago  Patient presented to the ER with respiratory distress and found to be hypoxic. Patient was suctioned and put on trach collar. Patient states that she's been short of breath and coughing up a lot of yellow-green sputum. She's been having fever and chills on and off. In the ER she was found to be tachycardic, tachypnea. Pt has a chronic trach secondary to OSA/COPD/CHF/morbid obesity. She states over the last 2 weeks she's been having moderate secretions from her trach site, thick yellow to green sputum production. She manages her own trach care at times at the NH per report. Pt stated she wears her PMV when eating meals; often wears it "24/7". Given chronic trach and thick secretions, pulmonary is consulted for further recommendations. Pt is verbally conversive w/ PMV placed; A/O x3; follows instructions. Pt denied any trouble swallowing stating she ate a regular diet at the NH. Noted increased WOB/effort when she became emotional when talking w/ SLP about her medical issues including not being able to sleep "for 3 days straight" and her anxiety (will pass these concerns onto the nuse.) Type of Study: Bedside Swallow Evaluation Previous Swallow Assessment: none indicated Diet Prior to this Study: Regular;Thin liquids Temperature Spikes Noted: No (wbc 21.2) Respiratory Status: Trach Collar (7 liters) History of Recent Intubation: No Behavior/Cognition: Alert;Cooperative (min.  labile) Oral Cavity Assessment: Within Functional Limits Oral Care Completed by SLP: Recent completion by staff Oral Cavity - Dentition: Adequate natural dentition Vision: Functional for self-feeding Self-Feeding Abilities: Able to feed self;Needs set up Patient Positioning: Upright in bed Baseline Vocal Quality: Normal Volitional Cough: Strong Volitional Swallow: Able to elicit    Oral/Motor/Sensory Function Overall Oral Motor/Sensory Function: Within functional limits   Ice Chips Ice chips: Not tested Other Comments: pt had a drink at her bedside she was drinking   Thin Liquid Thin Liquid: Within functional limits Presentation: Self Fed;Straw (~4 ozs+)    Nectar Thick Nectar Thick Liquid: Not tested   Honey Thick Honey Thick Liquid: Not tested   Puree Puree: Within functional limits Presentation: Self Fed;Spoon (~4 ozs)   Solid   GO   Solid: Within functional limits Presentation: Self Fed (5 trials)       Orinda Kenner, MS, CCC-SLP  Watson,Katherine 08/13/2015,2:23 PM

## 2015-08-13 NOTE — Progress Notes (Signed)
Patient is a recent re-admission (discharge on 4/10) no changes since last psychosocial completed by CSW. Patient is from SNF as a LTC patient:  [facility].  Plan will be for patient to return at DC once medically stable. No current needs at this time.  LCSW will follow acutely for patient needs while in hospital and spoke with facility of patient being admitted.  Patient is able to to return.    Patient is alone at the bedside, but has a friend Blanch Media involved in care. FL2 updated for MD to sign.   Lane Hacker, MSW Clinical Social Work: [employer] [phone]

## 2015-08-13 NOTE — Consult Note (Signed)
Consult History and Physical   SERVICE: Gynecology   Patient Name: Sherry Ramsey Patient MRN:   [id number]  CC: vaginal bleeding  HPI: Sherry Ramsey is a 35 y.o. G0 who has been admitted to the ICU in respiratory failure, a product of her COPD and congestive heart failure.  She is morbidly obese with reported weight of 540lbs, and has a history of a tracheostomy for her respiratory status.  She lives in a nursing home. She was admitted earlier today due to respiratory distress, and found to be hypoxic, and met SIRS criteria.  She is on supplemental oxygen, triple antibiotics, and cardiac monitoring.  She is also anemic with a Hb of 8.0 on admission, and admits to chronic blood loss through vaginal bleeding.  I was consulted for this reason.  She was diagnosed with FIGO grade 1 endometrial adenocarcinoma by [facility] gynecologist Dr. Rica Mote from EMB done on 07/31/15 for heavy vaginal bleeding.  She is scheduled to see a Counselling psychologist next week for discussion of treatment.  In the meantime she has been on provera 73m daily, and her bleeding has not improved.     PMH: diastolic CHF, COPD, morbid obesity, sleep apnea, anemia, chronic pain, HTN, HLD, DM2,  PSH: tracheostomy POB: nullip PGYN: per Dr. NArleta Creeknote has had only one period 8 years ago and then recent episodes of continuous, heavy bleeding with clots.  S/P LEEP at age [age]for cervical dysplasia with no sequelae thus far. 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 at age 89   Allergies: ibuprofen Meds: see med rec (many)    Review of Systems: positives in bold GEN:   fevers, chills, weight changes, appetite changes, fatigue, night sweats HEENT:  HA, vision changes, hearing loss, congestion, rhinorrhea, sinus pressure, dysphagia CV:   CP, palpitations PULM:  SOB, cough GI:  abd pain, N/V/D/C GU:  dysuria, urgency, frequency MSK:  arthralgias, myalgias, back pain, swelling SKIN:  rashes, color changes, pallor NEURO:   numbness, weakness, tingling, seizures, dizziness, tremors PSYCH:  depression, anxiety, behavioral problems, confusion  HEME/LYMPH:  easy bruising or bleeding ENDO:  heat/cold intolerance  Past Obstetrical History: OB History    Gravida 0      Past Gynecologic History: Patient's last menstrual period was 07/27/2015. continuous.  Past Medical History: Past Medical History  Diagnosis Date  . Diabetes ([facility]   . HTN (hypertension)   . HLD (hyperlipidemia)   . Chronic pain   . CHF (congestive heart failure) ([facility]   . MDD (major depressive disorder) ([facility]   . Anxiety   . Blood transfusion without reported diagnosis   . Cancer ([facility]     Uterine    Past Surgical History:   Past Surgical History  Procedure Laterality Date  . Tracheostomy      Family History:  family history includes Hypertension in her mother. There is no history of Diabetes or Heart failure.  Social History:  Social History   Social History  . Marital Status: Single    Spouse Name: N/A  . Number of Children: N/A  . Years of Education: N/A   Occupational History  . Not on file.   Social History Main Topics  . Smoking status: Former SResearch scientist (life sciences) . Smokeless tobacco: Not on file  . Alcohol Use: No  . Drug Use: No  . Sexual Activity: Not on file   Other Topics Concern  . Not on file   Social History Narrative    Home Medications:  Medications reconciled in EPIC  No current facility-administered medications on file prior to encounter.   Current Outpatient Prescriptions on File Prior to Encounter  Medication Sig Dispense Refill  . atorvastatin (LIPITOR) 10 MG tablet Take 10 mg by mouth at bedtime.    . butalbital-acetaminophen-caffeine (FIORICET, ESGIC) 50-325-40 MG tablet Take 1 tablet by mouth every 4 (four) hours as needed for headache.    . carvedilol (COREG) 6.25 MG tablet Take 6.25 mg by mouth 2 (two) times daily with a meal.    . clonazePAM (KLONOPIN) 0.5 MG tablet Take 1 mg by mouth at  bedtime.    . diphenhydrAMINE (BENADRYL) 25 MG tablet Take 25 mg by mouth every 6 (six) hours as needed for itching.    . furosemide (LASIX) 40 MG tablet Take 40 mg by mouth.    Marland Kitchen guaiFENesin (MUCINEX) 600 MG 12 hr tablet Take 600 mg by mouth 2 (two) times daily as needed for cough.    Marland Kitchen HYDROmorphone (DILAUDID) 2 MG tablet Take 2 mg by mouth every 4 (four) hours as needed for severe pain.    Marland Kitchen lisinopril (PRINIVIL,ZESTRIL) 5 MG tablet Take 5 mg by mouth daily.    . medroxyPROGESTERone (PROVERA) 10 MG tablet Take 10 mg by mouth daily.    . Melatonin 5 MG TABS Take 10 mg by mouth at bedtime.    . Multiple Vitamin (MULTIVITAMIN WITH MINERALS) TABS tablet Take 1 tablet by mouth daily.    Marland Kitchen senna (SENOKOT) 8.6 MG tablet Take 2 tablets by mouth 2 (two) times daily.    Marland Kitchen topiramate (TOPAMAX) 25 MG tablet Take 25 mg by mouth 2 (two) times daily.    . traZODone (DESYREL) 50 MG tablet Take 200 mg by mouth at bedtime.    . sertraline (ZOLOFT) 50 MG tablet Take 50 mg by mouth daily.      Allergies:  Allergies  Allergen Reactions  . Ibuprofen Other (See Comments)    Reaction: unknown    Physical Exam:  Temp:  [98 F (36.7 C)-98.8 F (37.1 C)] 98.3 F (36.8 C) (04/19 1930) Pulse Rate:  [99-113] 103 (04/19 1800) Resp:  [22-37] 28 (04/19 1700) BP: (55-162)/(37-107) 136/79 mmHg (04/19 1800) SpO2:  [85 %-100 %] 100 % (04/19 2159) FiO2 (%):  [35 %-40 %] 40 % (04/19 2208) Weight:  [245.85 kg (542 lb)] 245.85 kg (542 lb) (04/19 0625)   General Appearance:  Obese female, alert and oriented x3, in ICU suite with trach collar  HEENT:  Normocephalic atraumatic, extraocular movements intact, moist mucous membranes Cardiovascular:  Normal S1/S2, regular rate and rhythm, no murmurs Pulmonary: distant breath sounds, occasional wheezes Abdomen:  Bowel sounds present, soft, nontender, nondistended, no abnormal masses, no epigastric pain Extremities:  bedridden Skin:  normal coloration and turgor, no  rashes, no suspicious skin lesions noted  Neurologic:  Cranial nerves 2-12 grossly intact Psychiatric:  Normal mood and affect, appropriate, no AH/VH Pelvic:  deferred  Labs/Studies:   CBC and Coags:  Lab Results  Component Value Date   WBC 21.2* 08/13/2015   NEUTOPHILPCT 75 08/07/2015   EOSPCT 3 08/07/2015   BASOPCT 1 08/07/2015   LYMPHOPCT 15 08/07/2015   HGB 8.0* 08/13/2015   HCT 26.4* 08/13/2015   MCV 81.7 08/13/2015   PLT 487* 08/13/2015   INR 1.06 08/07/2015   CMP:  Lab Results  Component Value Date   NA 135 08/13/2015   K 4.1 08/13/2015   CL 97* 08/13/2015   CO2 33* 08/13/2015  BUN 25* 08/13/2015   CREATININE 0.93 08/13/2015   CREATININE 0.84 08/07/2015   CREATININE 0.89 08/04/2015     Imaging: Dg Chest 2 View  08/03/2015  CLINICAL DATA:  Shortness of breath. EXAM: CHEST  2 VIEW COMPARISON:  None. FINDINGS: The heart size is moderately enlarged. There is a tracheostomy tube with tip above the carina. Pulmonary vascular congestion is noted. No airspace consolidation. No pleural effusion. Scoliosis deformity involves the thoracic spine. IMPRESSION: 1. Cardiac enlargement and pulmonary vascular congestion. Electronically Signed   By: Kerby Moors M.D.   On: 08/03/2015 16:11   Dg Chest Port 1 View  08/13/2015  CLINICAL DATA:  Shortness of breath. History of CHF and hypertension. EXAM: PORTABLE CHEST 1 VIEW COMPARISON:  08/03/2015 FINDINGS: Shallow inspiration. Cardiac enlargement with prominent pulmonary vascularity. Perihilar nodular changes likely represent prominent vessels on end. No definite edema. No blunting of costophrenic angles. No pneumothorax. Tracheostomy. IMPRESSION: Cardiac enlargement with pulmonary vascular congestion. No definite edema or consolidation. Electronically Signed   By: Lucienne Capers M.D.   On: 08/13/2015 06:57     Assessment / Plan:   ASHANTE YELLIN is a 35 y.o. G0 with endometrial cancer, and anemia due to chronic uterine bleeding, in  addition to respiratory failure and SIRS requiring ICU admission  1. Respiratory and cardiac conditions are being treated by Internal Medicine, Pulmonology, and Critical Care.   2. Uterine Bleeding: this is a product of her malignancy, and it is with trepidation that I offer any acute resolution.  However, the Provera she has been taking is woefully under-dosed for her cancer.  She is a very unlikely surgical candidate, especially at her current weight and cardiac/pulmonary status.  Thus, her options for treatment are hormonal vs radiation.  More studies will need to be conducted to determine whether radiation is necessary, and likely only if she is not responsive to hormonal therapy.  I would recommend and have ordered mega-dose progesterone (343m BID Megace) for several days to initially temper the bleeding, and then continue maintenance dose of 870mBID after that.  Her treatment will either be continuation of oral progesterone or a 5215mUD such as Minera.  If the patient remains inpatient for some time, oral agent will be continued as the Mirena has to be ordered and I'm not sure the hospital would order it for her (likely would require outpatient ordering) as Medicaid has its own specialty pharmacy, and it takes a few weeks to arrive.    Unfortunately, Ms. SmiMccorveys concerns about her future childbearing options, and I had a frank discussion that a) her physical status, cardiac disease, and respiratory disease are initial reasons to recommend against pregnancy, that they would likely be exacerbated in pregnancy and could possibly kill her.  B) that the treatment of choice in any endometrial cancer would be surgical removal of the uterus and tubes/ovaries, and while exception can be made in a woman of child-bearing age, the most success is seen with surgery.  But, patient again is not likely a surgical candidate, and hormonal therapy will be step 1.  C) there are other ways to create a family, and that  when she is able to consider providing to another, that adoption would be an option for her wishes for a family.  Realistically, she will not bear a child.  She understand this, and is sad but accepting of this reality.    As always, thank you for the opportunity to be involved with this patient's care.  I will be available to answer questions or at your or the patient's request.  Please let me know how I may of service.  I will follow along during her stay, but do not plan on seeing her again unless needed.  If she is unable to attend her scheduled oncology appointment on April 25th, please notify [facility] and make attempts to reschedule on her behalf.   ----- Larey Days, MD Attending Obstetrician and [facility]  60 minutes were spent providing care to this patient with >50% face to face.

## 2015-08-14 DIAGNOSIS — N939 Abnormal uterine and vaginal bleeding, unspecified: Secondary | ICD-10-CM

## 2015-08-14 DIAGNOSIS — J9602 Acute respiratory failure with hypercapnia: Secondary | ICD-10-CM

## 2015-08-14 LAB — INFLUENZA PANEL BY PCR (TYPE A & B)
H1N1FLUPCR: NOT DETECTED
INFLAPCR: NEGATIVE
Influenza B By PCR: NEGATIVE

## 2015-08-14 LAB — BASIC METABOLIC PANEL
ANION GAP: 7 (ref 5–15)
BUN: 21 mg/dL — ABNORMAL HIGH (ref 6–20)
CHLORIDE: 99 mmol/L — AB (ref 101–111)
CO2: 33 mmol/L — AB (ref 22–32)
Calcium: 9 mg/dL (ref 8.9–10.3)
Creatinine, Ser: 1.09 mg/dL — ABNORMAL HIGH (ref 0.44–1.00)
GFR calc Af Amer: >60 mL/min (ref >60)
GFR calc non Af Amer: >60 mL/min (ref >60)
GLUCOSE: 134 mg/dL — AB (ref 65–99)
POTASSIUM: 5 mmol/L (ref 3.5–5.1)
Sodium: 139 mmol/L (ref 135–145)

## 2015-08-14 LAB — CBC
HEMATOCRIT: 25.5 % — AB (ref 35.0–47.0)
HEMOGLOBIN: 7.8 g/dL — AB (ref 12.0–16.0)
MCH: 24.9 pg — AB (ref 26.0–34.0)
MCHC: 30.5 g/dL — ABNORMAL LOW (ref 32.0–36.0)
MCV: 81.6 fL (ref 80.0–100.0)
Platelets: 459 10*3/uL — ABNORMAL HIGH (ref 150–440)
RBC: 3.12 MIL/uL — AB (ref 3.80–5.20)
RDW: 18.2 % — ABNORMAL HIGH (ref 11.5–14.5)
WBC: 26.4 10*3/uL — ABNORMAL HIGH (ref 3.6–11.0)

## 2015-08-14 LAB — VANCOMYCIN, TROUGH: Vancomycin Tr: 21 ug/mL (ref 10–20)

## 2015-08-14 LAB — PREPARE RBC (CROSSMATCH)

## 2015-08-14 MED ORDER — CHLORHEXIDINE GLUCONATE CLOTH 2 % EX PADS
6.0000 | MEDICATED_PAD | Freq: Every day | CUTANEOUS | Status: AC
  Administered 2015-08-15 – 2015-08-19 (×4): 6 via TOPICAL

## 2015-08-14 MED ORDER — VANCOMYCIN HCL 10 G IV SOLR
1250.0000 mg | Freq: Three times a day (TID) | INTRAVENOUS | Status: DC
  Administered 2015-08-15 – 2015-08-17 (×4): 1250 mg via INTRAVENOUS
  Filled 2015-08-14 (×6): qty 1250

## 2015-08-14 MED ORDER — FUROSEMIDE 10 MG/ML IJ SOLN
20.0000 mg | Freq: Once | INTRAMUSCULAR | Status: AC
  Administered 2015-08-14: 20 mg via INTRAVENOUS
  Filled 2015-08-14: qty 2

## 2015-08-14 MED ORDER — HYDROMORPHONE HCL 1 MG/ML IJ SOLN
2.0000 mg | INTRAMUSCULAR | Status: DC | PRN
  Administered 2015-08-14 – 2015-08-15 (×5): 2 mg via INTRAVENOUS
  Filled 2015-08-14 (×5): qty 2

## 2015-08-14 MED ORDER — MUPIROCIN 2 % EX OINT
1.0000 "application " | TOPICAL_OINTMENT | Freq: Two times a day (BID) | CUTANEOUS | Status: AC
  Administered 2015-08-14 – 2015-08-18 (×9): 1 via NASAL
  Filled 2015-08-14: qty 22

## 2015-08-14 MED ORDER — LORAZEPAM 2 MG/ML IJ SOLN
1.0000 mg | Freq: Once | INTRAMUSCULAR | Status: AC
Start: 2015-08-14 — End: 2015-08-15
  Administered 2015-08-15: 2 mg via INTRAVENOUS
  Filled 2015-08-14 (×2): qty 1

## 2015-08-14 MED ORDER — SODIUM CHLORIDE 0.9 % IV SOLN
Freq: Once | INTRAVENOUS | Status: AC
  Administered 2015-08-14: 17:00:00 via INTRAVENOUS

## 2015-08-14 NOTE — Progress Notes (Signed)
Pharmacy Antibiotic Note  Sherry Ramsey is a 35 y.o. female admitted on 08/13/2015 with pneumonia.  Pharmacy has been consulted for vancomycin, Zosyn, and Levaquin dosing.  Plan: After discussion with Dr. Stevenson Clinch, will continue current antibiotics for now.   Vancomycin trough is slightly above goal so will change dosing to 1250 mg iv q 8 hours and check another trough with the 5th dose of this new regimen. Goal trough 15-20 mcg/ml.   Continue Zosyn 4.5 g IV q 8 h EI.   Continue Levaquin 750 mg iv q 24 hours.     Height: 5\' 4"  (162.6 cm) Weight: (!) 542 lb (245.85 kg) IBW/kg (Calculated) : 54.7  Temp (24hrs), Avg:98.5 F (36.9 C), Min:98.3 F (36.8 C), Max:98.7 F (37.1 C)   Recent Labs Lab 08/13/15 0627 08/13/15 0838 08/13/15 1147 08/14/15 0453 08/14/15 0621 08/14/15 1530  WBC 21.2*  --   --   --  26.4*  --   CREATININE 0.93  --   --  1.09*  --   --   LATICACIDVEN  --  0.6 0.8  --   --   --   VANCOTROUGH  --   --   --   --   --  21*    Estimated Creatinine Clearance: 147.8 mL/min (by C-G formula based on Cr of 1.09).    Allergies  Allergen Reactions  . Ibuprofen Other (See Comments)    Reaction: unknown    Antimicrobials this admission: Levaquin 4/19 >>  Vancomycin  4/19 >>  Zosyn 4/19 >>  Dose adjustments this admission:   Microbiology results: Sputum: pending  MRSA PCR: positive 4/19 Blood cx: NGTD x 2  Thank you for allowing pharmacy to be a part of this patient's care.  Napoleon Form 08/14/2015 4:38 PM

## 2015-08-14 NOTE — Progress Notes (Signed)
Called by RN to place pt on vent for desats and lethargy. Placed pt on vent with PSV. Pt tol ok at this time.

## 2015-08-14 NOTE — Progress Notes (Signed)
Patient ID: Sherry Ramsey, female   DOB: 10/09/1980, 35 y.o.   MRN: [id number]  Since patient on ventilator.  Dr Stevenson Clinch to take over care.  Dr Leslye Peer

## 2015-08-14 NOTE — Progress Notes (Signed)
Difficult to awaken.  Drifts back to sleep within seconds.  Dr Stevenson Clinch aware.  Will continue to monitor.

## 2015-08-14 NOTE — Progress Notes (Signed)
Pt requested to be taken off the vent and placed back on ATC. Pt is tol 40% ATC well at this time SPO2 94%, RR 24. Will cont. To monitor pt.

## 2015-08-14 NOTE — Progress Notes (Signed)
Spoke with Dr. Stevenson Clinch patient is lethargic compared to yesterday. Mungal evaluated patient and orders for ABG and to place back on vent.

## 2015-08-14 NOTE — Progress Notes (Signed)
Patient is stating that she is agitated and that she doesn't understand what she is doing here at the hospital. Re-iterated what Dr Stevenson Clinch said to her when he rounded on her. We are supporting her on the vent, antibiotics, steriods, medication changes and I had ativan to give her so she could relax. Patient states that she does not want the 1mg  of ativan ordered. I will ask Dr Stevenson Clinch if he can order something different or a higher dosage .

## 2015-08-14 NOTE — Progress Notes (Signed)
Spoke with Dr Stevenson Clinch. Patient is complaining of shortness of breath and back hurting. Inner cannula changed, suctioned, bed ordered. Dilaudid ordered and given. Patient requesting to remove vent and go back on trach collar. Explained that her PCO2 was elevated.

## 2015-08-14 NOTE — Progress Notes (Addendum)
Pharmacy Antibiotic Note  Sherry Ramsey is a 35 y.o. female admitted on 08/13/2015 with pneumonia.  Pharmacy has been consulted for vancomycin, Zosyn, and Levaquin dosing.  Plan: After discussion with Dr. Stevenson Clinch, will continue current antibiotics for now.   Vancomycin 1750 mg iv once then Vancomycin 1500 IV every 8 hours with stacked dosing and a trough with the 5th total dose.  Goal trough 15-20 mcg/mL. Trough scheduled for 1530 04/20.   Continue Zosyn 4.5 g IV q 8 h EI.   Continue Levaquin 750 mg iv q 24 hours.     Height: 5\' 4"  (162.6 cm) Weight: (!) 542 lb (245.85 kg) IBW/kg (Calculated) : 54.7  Temp (24hrs), Avg:98.3 F (36.8 C), Min:98 F (36.7 C), Max:98.7 F (37.1 C)   Recent Labs Lab 08/07/15 1514 08/13/15 0627 08/13/15 0838 08/13/15 1147 08/14/15 0453 08/14/15 0621  WBC 13.9* 21.2*  --   --   --  26.4*  CREATININE 0.84 0.93  --   --  1.09*  --   LATICACIDVEN  --   --  0.6 0.8  --   --     Estimated Creatinine Clearance: 147.8 mL/min (by C-G formula based on Cr of 1.09).    Allergies  Allergen Reactions  . Ibuprofen Other (See Comments)    Reaction: unknown    Antimicrobials this admission: Levaquin 4/19 >>  Vancomycin  4/19 >>  Zosyn 4/19 >>  Dose adjustments this admission:   Microbiology results: Sputum: pending  MRSA PCR: positive 4/19 Blood cx: NGTD x 2  Thank you for allowing pharmacy to be a part of this patient's care.  Ulice Dash D 08/14/2015 1:32 PM

## 2015-08-14 NOTE — Progress Notes (Addendum)
Speech Language Pathology Treatment: Dysphagia  Patient Details Name: Sherry Ramsey MRN: [id number] DOB: 12-30-1980 Today's Date: 08/14/2015 Time: [id number] SLP Time Calculation (min) (ACUTE ONLY): 42 min  Assessment / Plan / Recommendation Clinical Impression  Pt seen for toleration of po's. Pt remains w/ chronic trach on full vent support currently. She has been more lethargic and this morning ABG showed she had hypercapnia, CO2 was 95. She is having episodes of altered mental status/confusion due to hypercapnia. Pt was finishing her lunch meal; consumed ~6-8 ozs of thin liquids via straw. No overt s/s of aspiration noted; no decline in respiratory status or O2 sats. Pt was unable to wear the PMV so unable to assess vocal quality. Pt fed self w/ setup assist. Pt does appear emotional and feels uncomfortable w/ her breathing; RT is addressing. As pt had no decline in status during oral intake w/ SLP while having to be on vent, recommend pt continue w/ current oral diet but w/ strict monitoring for any s/s of aspiration or decline in respiratory status during meals. Recommend use of PMV once pt is able to wean to trach collar again. ST will f/u w/ further education and toleration of diet next 1-3 days. NSG updated and agreed.   HPI HPI: Pt is a 35 y.o. female with a known history of Obesity, HTN, DM, Anxiety, MDD(major depressive dis.), CHF, and COPD. She received a tracheostomy several weeks ago  Patient presented to the ER with respiratory distress and found to be hypoxic. Patient was suctioned and put on trach collar. Patient states that she's been short of breath and coughing up a lot of yellow-green sputum. She's been having fever and chills on and off. In the ER she was found to be tachycardic, tachypnea. Pt has a chronic trach secondary to OSA/COPD/CHF/morbid obesity. She states over the last 2 weeks she's been having moderate secretions from her trach site, thick yellow to green sputum production. She  manages her own trach care at times at the NH per report. Pt stated she wears her PMV when eating meals; often wears it "24/7". Given chronic trach and thick secretions, pulmonary is consulted for further recommendations. Pt is verbally conversive w/ PMV placed; A/O x3; follows instructions. Pt denied any trouble swallowing stating she ate a regular diet at the NH. Noted increased WOB/effort when she became emotional when talking w/ SLP about her medical issues involving her breathing as well. NSG is addressing her anxiety and RT is addressing her breathing concerns and feelings of SOB. Pt is on vent support at this time d/t CO2 retention. She is eating her lunch meal not wearing the PMV d/t needing to be on vent support.       SLP Plan        Recommendations  Diet recommendations: Regular;Thin liquid (meats cut small, moistened) Liquids provided via: Cup;Straw Medication Administration: Whole meds with liquid (or w/ Puree for easier swallowing) Supervision: Patient able to self feed (setup assistance) Compensations: Minimize environmental distractions;Slow rate;Small sips/bites;Follow solids with liquid (rest breaks to avoid increased WOB/SOB) Postural Changes and/or Swallow Maneuvers: Seated upright 90 degrees;Upright 30-60 min after meal      Patient may use Passy-Muir Speech Valve:  (when possible w/ meals; NSG superivision)      General recommendations:  (Dietician f/u) Oral Care Recommendations: Oral care BID;Staff/trained caregiver to provide oral care Follow up Recommendations: None     GO               Belenda Cruise  Shon Baton, [place], CCC-SLP  Amay Mijangos 08/14/2015, 4:03 PM

## 2015-08-14 NOTE — Progress Notes (Addendum)
PULMONARY / CRITICAL CARE MEDICINE   Name: Sherry Ramsey MRN: [id number] DOB: November 21, 1980    ADMISSION DATE:  08/13/2015  BRIEF HISTORY: 35 year old female with a known history of COPD, just of heart failure, presented to the ER with main complaint of respiratory distress/shortness of breath/fever/not feeling well for the past 2 weeks. She has a chronic trach secondary to OSA/COPD/CHF/morbid obesity. She states over the last 2 weeks she's been having moderate secretions from her trach site, thick yellow to green sputum production. She admits to intermittent fevers and chills. In the ER she was found to be tachycardic, tachypneic, and she is without elevated white count. Given chronic trach and thick secretions, pulmonary was consulted for further recommendations.  SUBJECTIVE:  She more lethargic this morning, ABG showed she had hypercapnia, CO2 was 95. She is having episodes of altered mental status/confusion due to hypercapnia, and at times been noncompliant with being on and off the ventilator and with taking medications.   VITAL SIGNS: Temp:  [98 F (36.7 C)-98.7 F (37.1 C)] 98.7 F (37.1 C) (04/20 0701) Pulse Rate:  [81-113] 106 (04/20 1400) Resp:  [23-34] 25 (04/20 1400) BP: (110-162)/(62-112) 115/68 mmHg (04/20 1400) SpO2:  [87 %-100 %] 94 % (04/20 1400) FiO2 (%):  [35 %-40 %] 40 % (04/20 1351) HEMODYNAMICS:   VENTILATOR SETTINGS: Vent Mode:  [-] PSV;CPAP FiO2 (%):  [35 %-40 %] 40 % Set Rate:  [15 bmp] 15 bmp PEEP:  [5 cmH20] 5 cmH20 Pressure Support:  [15 cmH20] 15 cmH20 INTAKE / OUTPUT:  Intake/Output Summary (Last 24 hours) at 08/14/15 1448 Last data filed at 08/14/15 0500  Gross per 24 hour  Intake      0 ml  Output   2750 ml  Net  -2750 ml    Review of Systems  Constitutional: Positive for malaise/fatigue. Negative for fever and chills.  Eyes: Negative for blurred vision and double vision.  Respiratory: Positive for cough, shortness of breath and wheezing.    Cardiovascular: Positive for palpitations. Negative for chest pain.  Gastrointestinal: Negative for heartburn, nausea, vomiting and abdominal pain.  Genitourinary: Positive for hematuria.  Musculoskeletal: Positive for back pain.  Skin: Negative for itching and rash.  Neurological: Positive for weakness. Negative for dizziness and headaches.    Physical Exam  Constitutional: She is well-developed, well-nourished, and in no distress.  HENT:  Head: Normocephalic and atraumatic.  Right Ear: External ear normal.  Left Ear: External ear normal.  Neck: Normal range of motion. Neck supple. No thyromegaly present.  Cardiovascular: Normal rate, regular rhythm, normal heart sounds and intact distal pulses.   Pulmonary/Chest: She has wheezes. She has no rales.  Coarse upper airway sounds, decreased basilar sounds, mild expiratory wheezes  Abdominal: Soft. Bowel sounds are normal.  Musculoskeletal: Normal range of motion.  Neurological: She is alert.  More confused this morning.   Skin: Skin is warm and dry.  Nursing note and vitals reviewed.    LABS:  CBC  Recent Labs Lab 08/07/15 1514 08/13/15 0627 08/14/15 0621  WBC 13.9* 21.2* 26.4*  HGB 9.0* 8.0* 7.8*  HCT 29.4* 26.4* 25.5*  PLT 474* 487* 459*   Coag's  Recent Labs Lab 08/07/15 1514  APTT 31  INR 1.06   BMET  Recent Labs Lab 08/07/15 1514 08/13/15 0627 08/14/15 0453  NA 138 135 139  K 3.9 4.1 5.0  CL 99* 97* 99*  CO2 31 33* 33*  BUN 16 25* 21*  CREATININE 0.84 0.93 1.09*  GLUCOSE 102*  131* 134*   Electrolytes  Recent Labs Lab 08/07/15 1514 08/13/15 0627 08/14/15 0453  CALCIUM 9.1 8.8* 9.0   Sepsis Markers  Recent Labs Lab 08/13/15 0838 08/13/15 1147  LATICACIDVEN 0.6 0.8   ABG  Recent Labs Lab 08/14/15 0934  PHART 7.23*  PCO2ART 95*  PO2ART 90   Liver Enzymes No results for input(s): AST, ALT, ALKPHOS, BILITOT, ALBUMIN in the last 168 hours. Cardiac Enzymes  Recent Labs Lab  08/13/15 0627 08/13/15 1147 08/13/15 1524  TROPONINI 0.04* 0.05* 0.04*   Glucose  Recent Labs Lab 08/13/15 1056  GLUCAP 106*    Imaging No results found.  Cultures: BCx2  UC  Sputum 4/19 MRSA positive  Antibiotics: Levaquin 4/19>> Zosyn 4/19>>  Lines:  ASSESSMENT / PLAN: 35 year old female past medical history of COPD, OSA status post tracheostomy, morbid obesity, congestive heart failure (grade 1 diastolic) our cardiomyopathy, seen in consultation for shortness of breath   A: Dyspnea Possible viral syndrome/bronchitis Chronic tracheostomy Acute on Chronic Hypercapnic respiratory failure Morbid obesity Cardiomegaly Congestive heart failure HTN HLD Endometerial Adenocarcinoma Anemia - blood loss, due to vaginal bleeding  Plan - Now with hypercapnic respiratory failure, requiring ventilator, however she is very noncompliant at this time. Advised patient we will need MV with rate at least at nighttime.  -Patient has a chronic tracheostomy, will continue to urge use of ventilator given her level of hypercapnia -Sputum culture currently pending -Vaginal bleed secondary to endometrial adenocarcinoma, has been seen by gynecology, started on appropriate medications(megace).  Follow up as an outpatient with [facility]  -Continue bronchodilators, continue with Pulmicort, Prednisone wean -Continue blood pressure medications -Even though she has a tracheostomy, she is wearing a Passy-Muir valve, and is able to swallow appropriately - transfuse 1uPRBC today.  -CCM has taken over care since patient will require MV in the short term.    I have personally obtained a history, examined the patient, evaluated laboratory and imaging results, formulated the assessment and plan and placed orders.  The Patient requires high complexity decision making for assessment and support, frequent evaluation and titration of therapies, application of advanced monitoring technologies and  extensive interpretation of multiple databases. Critical Care Time devoted to patient care services described in this note is 45 mins   Vilinda Boehringer, MD [facility] Pager [phone] (please enter 7-digits) On Call Pager - [phone] (please enter 7-digits)  Note: This note was prepared with Dragon dictation along with smaller phrase technology. Any transcriptional errors that result from this process are unintentional.

## 2015-08-15 DIAGNOSIS — J441 Chronic obstructive pulmonary disease with (acute) exacerbation: Secondary | ICD-10-CM

## 2015-08-15 LAB — BLOOD GAS, ARTERIAL
Acid-Base Excess: 18.4 mmol/L — ABNORMAL HIGH (ref 0.0–3.0)
Allens test (pass/fail): POSITIVE — AB
BICARBONATE: 47.3 meq/L — AB (ref 21.0–28.0)
FIO2: 0.3
LHR: 16 {breaths}/min
MECHVT: 450 mL
O2 Saturation: 87.6 %
PEEP/CPAP: 5 cmH2O
Patient temperature: 37
pCO2 arterial: 94 mmHg (ref 32.0–48.0)
pH, Arterial: 7.31 — ABNORMAL LOW (ref 7.350–7.450)
pO2, Arterial: 59 mmHg — ABNORMAL LOW (ref 83.0–108.0)

## 2015-08-15 LAB — BASIC METABOLIC PANEL
ANION GAP: 2 — AB (ref 5–15)
BUN: 27 mg/dL — ABNORMAL HIGH (ref 6–20)
CALCIUM: 8.9 mg/dL (ref 8.9–10.3)
CHLORIDE: 94 mmol/L — AB (ref 101–111)
CO2: 42 mmol/L — ABNORMAL HIGH (ref 22–32)
CREATININE: 0.92 mg/dL (ref 0.44–1.00)
GFR calc non Af Amer: >60 mL/min (ref >60)
Glucose, Bld: 109 mg/dL — ABNORMAL HIGH (ref 65–99)
Potassium: 4.5 mmol/L (ref 3.5–5.1)
SODIUM: 138 mmol/L (ref 135–145)

## 2015-08-15 LAB — CBC
HCT: 27 % — ABNORMAL LOW (ref 35.0–47.0)
HEMOGLOBIN: 8.1 g/dL — AB (ref 12.0–16.0)
MCH: 24.9 pg — ABNORMAL LOW (ref 26.0–34.0)
MCHC: 30 g/dL — AB (ref 32.0–36.0)
MCV: 83 fL (ref 80.0–100.0)
Platelets: 472 10*3/uL — ABNORMAL HIGH (ref 150–440)
RBC: 3.26 MIL/uL — ABNORMAL LOW (ref 3.80–5.20)
RDW: 18 % — AB (ref 11.5–14.5)
WBC: 24.6 10*3/uL — ABNORMAL HIGH (ref 3.6–11.0)

## 2015-08-15 LAB — TYPE AND SCREEN
ABO/RH(D): O POS
Antibody Screen: NEGATIVE
Unit division: 0

## 2015-08-15 LAB — TRIGLYCERIDES: Triglycerides: 105 mg/dL (ref <150)

## 2015-08-15 LAB — PHOSPHORUS: PHOSPHORUS: 2.7 mg/dL (ref 2.5–4.6)

## 2015-08-15 LAB — MAGNESIUM: MAGNESIUM: 2.2 mg/dL (ref 1.7–2.4)

## 2015-08-15 MED ORDER — FENTANYL BOLUS VIA INFUSION
50.0000 ug | INTRAVENOUS | Status: DC | PRN
  Filled 2015-08-15: qty 50

## 2015-08-15 MED ORDER — MIDAZOLAM HCL 2 MG/2ML IJ SOLN
2.0000 mg | INTRAMUSCULAR | Status: DC | PRN
  Administered 2015-08-15 – 2015-08-20 (×6): 2 mg via INTRAVENOUS
  Filled 2015-08-15 (×6): qty 2

## 2015-08-15 MED ORDER — FREE WATER: 200.0000 mL | Freq: Three times a day (TID) | Status: DC

## 2015-08-15 MED ORDER — PRO-STAT SUGAR FREE PO LIQD: 30.0000 mL | Freq: Four times a day (QID) | ORAL | Status: DC

## 2015-08-15 MED ORDER — FENTANYL 2500MCG IN NS 250ML (10MCG/ML) PREMIX INFUSION
25.0000 ug/h | INTRAVENOUS | Status: DC
  Administered 2015-08-15: 50 ug/h via INTRAVENOUS
  Administered 2015-08-15 – 2015-08-17 (×5): 200 ug/h via INTRAVENOUS
  Administered 2015-08-18 (×2): 250 ug/h via INTRAVENOUS
  Administered 2015-08-19 – 2015-08-20 (×2): 150 ug/h via INTRAVENOUS
  Filled 2015-08-15 (×13): qty 250

## 2015-08-15 MED ORDER — MIDAZOLAM HCL 2 MG/2ML IJ SOLN
2.0000 mg | INTRAMUSCULAR | Status: AC | PRN
  Administered 2015-08-15 (×3): 2 mg via INTRAVENOUS
  Filled 2015-08-15 (×3): qty 2

## 2015-08-15 MED ORDER — VITAL HIGH PROTEIN PO LIQD: 1000.0000 mL | ORAL | Status: DC

## 2015-08-15 MED ORDER — PROPOFOL 1000 MG/100ML IV EMUL
5.0000 ug/kg/min | INTRAVENOUS | Status: DC
  Administered 2015-08-15: 5 ug/kg/min via INTRAVENOUS
  Administered 2015-08-15 – 2015-08-16 (×3): 25 ug/kg/min via INTRAVENOUS
  Administered 2015-08-16: 30 ug/kg/min via INTRAVENOUS
  Administered 2015-08-16: 25 ug/kg/min via INTRAVENOUS
  Administered 2015-08-16: 30 ug/kg/min via INTRAVENOUS
  Administered 2015-08-16 (×2): 25 ug/kg/min via INTRAVENOUS
  Administered 2015-08-17 (×2): 30 ug/kg/min via INTRAVENOUS
  Administered 2015-08-17: 50 ug/kg/min via INTRAVENOUS
  Administered 2015-08-17 (×2): 30 ug/kg/min via INTRAVENOUS
  Administered 2015-08-17: 40 ug/kg/min via INTRAVENOUS
  Administered 2015-08-17 (×2): 30 ug/kg/min via INTRAVENOUS
  Administered 2015-08-17: 40 ug/kg/min via INTRAVENOUS
  Administered 2015-08-17 (×2): 30 ug/kg/min via INTRAVENOUS
  Administered 2015-08-18: 39.989 ug/kg/min via INTRAVENOUS
  Administered 2015-08-18: 40 ug/kg/min via INTRAVENOUS
  Administered 2015-08-18: 35.041 ug/kg/min via INTRAVENOUS
  Administered 2015-08-18: 40 ug/kg/min via INTRAVENOUS
  Administered 2015-08-18: 39.989 ug/kg/min via INTRAVENOUS
  Administered 2015-08-18 (×2): 40 ug/kg/min via INTRAVENOUS
  Administered 2015-08-18: 30 ug/kg/min via INTRAVENOUS
  Administered 2015-08-18: 51.6 ug/kg/min via INTRAVENOUS
  Administered 2015-08-19 (×3): 30 ug/kg/min via INTRAVENOUS
  Filled 2015-08-15 (×40): qty 100

## 2015-08-15 MED ORDER — PROPOFOL 1000 MG/100ML IV EMUL
INTRAVENOUS | Status: AC
  Administered 2015-08-15: 5 ug/kg/min via INTRAVENOUS
  Filled 2015-08-15: qty 100

## 2015-08-15 MED ORDER — PANTOPRAZOLE SODIUM 40 MG IV SOLR
40.0000 mg | INTRAVENOUS | Status: DC
  Administered 2015-08-15 – 2015-08-17 (×3): 40 mg via INTRAVENOUS
  Filled 2015-08-15 (×3): qty 40

## 2015-08-15 MED ORDER — FENTANYL CITRATE (PF) 100 MCG/2ML IJ SOLN
50.0000 ug | Freq: Once | INTRAMUSCULAR | Status: AC
  Administered 2015-08-15: 50 ug via INTRAVENOUS
  Filled 2015-08-15: qty 2

## 2015-08-15 NOTE — Progress Notes (Signed)
Initial Nutrition Assessment  DOCUMENTATION CODES:   Morbid obesity  INTERVENTION:  -Received verbal order from MD Mungal to start TF once NG placed; recommend starting Vital High Protein at rate of 20 ml/hr with goal rate of 45 ml/hr with Prostat QID providing 1480 kcals, 155 g of protein, 907 mL of free water. TF ordered via Adult Tube Feeding Protocol. Continue to assess   NUTRITION DIAGNOSIS:   Inadequate oral intake related to acute illness as evidenced by NPO status.  GOAL:   Provide needs based on ASPEN/SCCM guidelines  MONITOR:   TF tolerance, Vent status, Labs, Weight trends, I & O's, Skin  REASON FOR ASSESSMENT:   Low Braden, Ventilator    ASSESSMENT:    35 yo female admitted with respiratory distress with hx of chronic trach due to OSA/COPD/CHF and symptomatic anemia with chronic vaginal bleeding diagnosed with endometrial cancer on 07/31/15  Pt currently on vent support via trach, NG to be placed today, starting fentanyl/versed for sedation  Diet Order:  Diet NPO time specified   Digestive System: abdomen soft/obese, BS hypoactive  Skin:  Reviewed, no issues  Last BM:  4/20 large soft yello BM   Labs: potassium 5.0  Meds: lasix, MVI, solmuedrol, senokot, megace  Height:   Ht Readings from Last 1 Encounters:  08/13/15 5\' 4"  (1.626 m)    Weight:   Wt Readings from Last 1 Encounters:  08/13/15 542 lb (245.85 kg)    Ideal Body Weight:  60 kg  BMI:  Body mass index is 92.99 kg/(m^2).  Estimated Nutritional Needs:   Kcal:  1320-1500 kcals (22-25 kcals/kg IBW per APEN guidelines for BMI >50)  Protein:  >/= 150 g (2.5 g/kg)   Fluid:  >/= 1.5 L per day  EDUCATION NEEDS:   No education needs identified at this time  [place], [place], [place] [phone] Pager  [phone] Weekend/On-Call Pager

## 2015-08-15 NOTE — Progress Notes (Signed)
After discussion in ICU rounds, per Dr. Stevenson Clinch, administerd 2 mg Versed, 50 mcg Fentanyl and initiated Fentanyl drip at 50 mcg. Patient placed on PRVC at 40% rate 16, 450 TV, and Peep 5.

## 2015-08-15 NOTE — Care Management Note (Signed)
Case Management Note  Patient Details  Name: Daniya Hermance MRN: [id number] Date of Birth: 01-25-1981  Subjective/Objective:  From [facility]. Chronic trach from OSA.  Presented with respiratory distress, fever, and shortness of breath x 2 weeks.  Currently on full vent support and sedated due to agitation. NG and foley. WBC 26.4 (4/20). Hgb 7.8. Transfused 4/20.  Vaginal bleeding due to endometrial ca. Followed by onocolgy.                  Action/Plan: CSW following. Will return back to [facility] once medically stable.   Expected Discharge Date:                  Expected Discharge Plan:  [place]  In-House Referral:     Discharge planning Services     Post Acute Care Choice:    Choice offered to:     DME Arranged:    DME Agency:     HH Arranged:    [place] Agency:     Status of Service:  In process, will continue to follow  Medicare Important Message Given:    Date Medicare IM Given:    Medicare IM give by:    Date Additional Medicare IM Given:    Additional Medicare Important Message give by:     If discussed at [place] of Stay Meetings, dates discussed:    Additional Comments:  Jolly Mango, RN 08/15/2015, 1:40 PM

## 2015-08-15 NOTE — Progress Notes (Signed)
Sizewise bed changed out due to malfunction.  Pt moved, using lift without incident.  Tolerated well.  Will continue to monitor.

## 2015-08-15 NOTE — Progress Notes (Signed)
Dr. Stevenson Clinch had ordered BMP, MG, Phos, CBC for this a.m. When lab tech went in to draw labs, patient refused.

## 2015-08-15 NOTE — Progress Notes (Signed)
Pt is now sedated on Propofol, Fentanyl, and Versed pushes. Pt had foley placed. Bath given. 2 attempts at NG placement with out success. Pt not sedated enough for NG placement.  Report given to [facility].

## 2015-08-15 NOTE — Progress Notes (Signed)
Patient on [facility], sedated with 200 mcg Fentanyl. Still awake and wanting to eat, drink and come off of vent. Explained MDs want her to be on rate on vent for 24 hours. When patient adequately sedated, will insert NG and foley. Report given to [place], Therapist, sports.

## 2015-08-15 NOTE — Progress Notes (Signed)
Speech Therapy Note: reviewed chart notes; pt remains on full vent support and will be further sedated d/t apparent agitation; a foley and NG will be inserted when sedated per MD note. Pt has been refusing lab draws as well per NSG note. Pt is NPO. ST will f/u w/ pt's status next week.

## 2015-08-15 NOTE — Progress Notes (Signed)
Pharmacy Antibiotic Note  Sherry Ramsey is a 35 y.o. female admitted on 08/13/2015 with pneumonia.  Pharmacy has been consulted for vancomycin, Zosyn, and Levaquin dosing.  Plan: Will continue current antibiotics for now.   Continue Vancomycin 1250 mg IV q8 hours.Trough level scheduled for 4/23 @ 01:00.   Continue Zosyn 4.5 g IV q 8 h EI.   Continue Levaquin 750 mg iv q 24 hours.     Height: 5\' 4"  (162.6 cm) Weight: (!) 542 lb (245.85 kg) IBW/kg (Calculated) : 54.7  Temp (24hrs), Avg:99.1 F (37.3 C), Min:99 F (37.2 C), Max:99.2 F (37.3 C)   Recent Labs Lab 08/13/15 0627 08/13/15 0838 08/13/15 1147 08/14/15 0453 08/14/15 0621 08/14/15 1530  WBC 21.2*  --   --   --  26.4*  --   CREATININE 0.93  --   --  1.09*  --   --   LATICACIDVEN  --  0.6 0.8  --   --   --   VANCOTROUGH  --   --   --   --   --  21*    Estimated Creatinine Clearance: 147.8 mL/min (by C-G formula based on Cr of 1.09).    Allergies  Allergen Reactions  . Ibuprofen Other (See Comments)    Reaction: unknown    Antimicrobials this admission: Levaquin 4/19 >>  Vancomycin  4/19 >>  Zosyn 4/19 >>  Dose adjustments this admission:   Microbiology results: Sputum: pending  MRSA PCR: positive 4/19 Blood cx: NGTD x 2  Thank you for allowing pharmacy to be a part of this patient's care.  Declin Rajan D 08/15/2015 7:28 AM

## 2015-08-15 NOTE — Progress Notes (Signed)
PULMONARY / CRITICAL CARE MEDICINE   Name: Sherry Ramsey MRN: [id number] DOB: [birth year]    ADMISSION DATE:  08/13/2015  BRIEF HISTORY: 35 year old female with a known history of COPD, just of heart failure, presented to the ER with main complaint of respiratory distress/shortness of breath/fever/not feeling well for the past 2 weeks. She has a chronic trach secondary to OSA/COPD/CHF/morbid obesity. She states over the last 2 weeks she's been having moderate secretions from her trach site, thick yellow to green sputum production. She admits to intermittent fevers and chills. In the ER she was found to be tachycardic, tachypneic, and she is without elevated white count. Given chronic trach and thick secretions, pulmonary was consulted for further recommendations.  SUBJECTIVE:  Patient placed on vent due to resp failure, patient on full vent support this AM, Follow up abg pending   VITAL SIGNS: Temp:  [98.3 F (36.8 C)-99.2 F (37.3 C)] 98.3 F (36.8 C) (04/21 0900) Pulse Rate:  [83-121] 108 (04/21 1100) Resp:  [20-39] 24 (04/21 1100) BP: (105-165)/(61-135) 128/110 mmHg (04/21 1100) SpO2:  [90 %-100 %] 95 % (04/21 1133) FiO2 (%):  [30 %-40 %] 30 % (04/21 1133) HEMODYNAMICS:   VENTILATOR SETTINGS: Vent Mode:  [-] PRVC FiO2 (%):  [30 %-40 %] 30 % Set Rate:  [16 bmp] 16 bmp Vt Set:  [450 mL] 450 mL PEEP:  [5 cmH20] 5 cmH20 Pressure Support:  [10 cmH20-15 cmH20] 10 cmH20 INTAKE / OUTPUT:  Intake/Output Summary (Last 24 hours) at 08/15/15 1206 Last data filed at 08/14/15 1924  Gross per 24 hour  Intake    300 ml  Output      0 ml  Net    300 ml    Review of Systems  Unable to perform ROS: critical illness    Physical Exam  Constitutional: She is well-developed, well-nourished, and in no distress.  HENT:  Head: Normocephalic and atraumatic.  Right Ear: External ear normal.  Left Ear: External ear normal.  Neck: Normal range of motion. Neck supple. No thyromegaly present.   Cardiovascular: Normal rate, regular rhythm, normal heart sounds and intact distal pulses.   Pulmonary/Chest: She has wheezes. She has no rales.  Coarse upper airway sounds, decreased basilar sounds, mild expiratory wheezes  Abdominal: Soft. Bowel sounds are normal.  Musculoskeletal: Normal range of motion.  Neurological:  gcs<8T  Skin: Skin is warm and dry.  Nursing note and vitals reviewed.    LABS:  CBC  Recent Labs Lab 08/13/15 0627 08/14/15 0621  WBC 21.2* 26.4*  HGB 8.0* 7.8*  HCT 26.4* 25.5*  PLT 487* 459*   Coag's No results for input(s): APTT, INR in the last 168 hours. BMET  Recent Labs Lab 08/13/15 0627 08/14/15 0453  NA 135 139  K 4.1 5.0  CL 97* 99*  CO2 33* 33*  BUN 25* 21*  CREATININE 0.93 1.09*  GLUCOSE 131* 134*   Electrolytes  Recent Labs Lab 08/13/15 0627 08/14/15 0453  CALCIUM 8.8* 9.0   Sepsis Markers  Recent Labs Lab 08/13/15 0838 08/13/15 1147  LATICACIDVEN 0.6 0.8   ABG  Recent Labs Lab 08/14/15 0934  PHART 7.23*  PCO2ART 95*  PO2ART 90   Liver Enzymes No results for input(s): AST, ALT, ALKPHOS, BILITOT, ALBUMIN in the last 168 hours. Cardiac Enzymes  Recent Labs Lab 08/13/15 0627 08/13/15 1147 08/13/15 1524  TROPONINI 0.04* 0.05* 0.04*   Glucose  Recent Labs Lab 08/13/15 1056  GLUCAP 106*    Imaging No results found.  Cultures: BCx2  UC  Sputum 4/19 MRSA positive  Antibiotics: Levaquin 4/19>> Zosyn 4/19>>  Lines:  ASSESSMENT / PLAN: 35 year old female past medical history of COPD, OSA status post tracheostomy, morbid obesity, congestive heart failure (grade 1 diastolic) our cardiomyopathy, seen in consultation for shortness of breath   A: Dyspnea Possible viral syndrome/bronchitis Chronic tracheostomy Acute on Chronic Hypercapnic respiratory failure Morbid obesity Cardiomegaly Congestive heart failure HTN HLD Endometerial Adenocarcinoma Anemia - blood loss, due to vaginal  bleeding  Plan - Now with hypercapnic respiratory failure, requiring ventilator-wean vent in next 24-48 hrs as tolerated -Sputum culture currently pending -Vaginal bleed secondary to endometrial adenocarcinoma, has been seen by gynecology, started on appropriate medications(megace).  Follow up as an outpatient with [facility]  -Continue bronchodilators, continue with Pulmicort, Prednisone wean -Continue blood pressure medications -sedation as tolerated  Plan for [facility] referral   I have personally obtained a history, examined the patient, evaluated laboratory and imaging results, formulated the assessment and plan and placed orders.  The Patient requires high complexity decision making for assessment and support, frequent evaluation and titration of therapies, application of advanced monitoring technologies and extensive interpretation of multiple databases. Critical Care Time devoted to patient care services described in this note is 35 mins  .    prognosis is guarded.     Corrin Parker, M.D.  [facility]  Medical Director [place] Director [facility] Cardio-Pulmonary Department

## 2015-08-15 NOTE — Progress Notes (Addendum)
Chaplain rounded the unit and provided a compassionate presence and spiritual support to the patient.  Chaplain Syrina Wake [phone] 

## 2015-08-15 NOTE — Progress Notes (Signed)
11 beat run of V-Tach.  E-link aware.  Will continue to monitor.

## 2015-08-16 ENCOUNTER — Inpatient Hospital Stay: Payer: Medicaid Other

## 2015-08-16 LAB — BLOOD GAS, ARTERIAL
ACID-BASE EXCESS: 20.7 mmol/L — AB (ref 0.0–3.0)
Bicarbonate: 49.1 mEq/L — ABNORMAL HIGH (ref 21.0–28.0)
FIO2: 0.4
MECHVT: 400 mL
Mechanical Rate: 22
O2 Saturation: 93.8 %
PCO2 ART: 91 mmHg — AB (ref 32.0–48.0)
PEEP/CPAP: 5 cmH2O
PH ART: 7.34 — AB (ref 7.350–7.450)
Patient temperature: 37
pO2, Arterial: 74 mmHg — ABNORMAL LOW (ref 83.0–108.0)

## 2015-08-16 LAB — BASIC METABOLIC PANEL
ANION GAP: 5 (ref 5–15)
BUN: 29 mg/dL — ABNORMAL HIGH (ref 6–20)
CHLORIDE: 94 mmol/L — AB (ref 101–111)
CO2: 41 mmol/L — AB (ref 22–32)
Calcium: 8.5 mg/dL — ABNORMAL LOW (ref 8.9–10.3)
Creatinine, Ser: 0.89 mg/dL (ref 0.44–1.00)
GFR calc non Af Amer: >60 mL/min (ref >60)
Glucose, Bld: 83 mg/dL (ref 65–99)
Potassium: 4.1 mmol/L (ref 3.5–5.1)
Sodium: 140 mmol/L (ref 135–145)

## 2015-08-16 LAB — GLUCOSE, CAPILLARY
GLUCOSE-CAPILLARY: 69 mg/dL (ref 65–99)
Glucose-Capillary: 125 mg/dL — ABNORMAL HIGH (ref 65–99)
Glucose-Capillary: 137 mg/dL — ABNORMAL HIGH (ref 65–99)
Glucose-Capillary: 74 mg/dL (ref 65–99)
Glucose-Capillary: 88 mg/dL (ref 65–99)

## 2015-08-16 LAB — CBC
HEMATOCRIT: 24.2 % — AB (ref 35.0–47.0)
HEMOGLOBIN: 7.3 g/dL — AB (ref 12.0–16.0)
MCH: 24.9 pg — ABNORMAL LOW (ref 26.0–34.0)
MCHC: 30.2 g/dL — ABNORMAL LOW (ref 32.0–36.0)
MCV: 82.7 fL (ref 80.0–100.0)
Platelets: 381 10*3/uL (ref 150–440)
RBC: 2.92 MIL/uL — ABNORMAL LOW (ref 3.80–5.20)
RDW: 18.2 % — AB (ref 11.5–14.5)
WBC: 17.4 10*3/uL — AB (ref 3.6–11.0)

## 2015-08-16 LAB — PHOSPHORUS: PHOSPHORUS: 2.9 mg/dL (ref 2.5–4.6)

## 2015-08-16 LAB — MAGNESIUM: Magnesium: 2 mg/dL (ref 1.7–2.4)

## 2015-08-16 MED ORDER — INSULIN ASPART 100 UNIT/ML ~~LOC~~ SOLN
0.0000 [IU] | SUBCUTANEOUS | Status: DC
  Administered 2015-08-16 – 2015-08-20 (×6): 2 [IU] via SUBCUTANEOUS
  Filled 2015-08-16 (×4): qty 2
  Filled 2015-08-16: qty 3
  Filled 2015-08-16: qty 2

## 2015-08-16 MED ORDER — DEXTROSE 50 % IV SOLN
1.0000 | Freq: Once | INTRAVENOUS | Status: AC
  Administered 2015-08-16: 50 mL via INTRAVENOUS

## 2015-08-16 MED ORDER — CLONAZEPAM 1 MG PO TABS: 1.0000 mg | ORAL_TABLET | Freq: Every day | ORAL | Status: DC

## 2015-08-16 MED ORDER — HYDRALAZINE HCL 20 MG/ML IJ SOLN: 10.0000 mg | Freq: Four times a day (QID) | INTRAMUSCULAR | Status: DC | PRN

## 2015-08-16 MED ORDER — SERTRALINE HCL 50 MG PO TABS: 50.0000 mg | ORAL_TABLET | Freq: Every day | ORAL | Status: DC

## 2015-08-16 MED ORDER — CARVEDILOL 6.25 MG PO TABS
6.2500 mg | ORAL_TABLET | Freq: Two times a day (BID) | ORAL | Status: DC
  Administered 2015-08-16: 6.25 mg via NASOGASTRIC
  Filled 2015-08-16: qty 1

## 2015-08-16 MED ORDER — DEXTROSE 50 % IV SOLN
INTRAVENOUS | Status: AC
  Administered 2015-08-16: 50 mL via INTRAVENOUS
  Filled 2015-08-16: qty 50

## 2015-08-16 MED ORDER — DEXTROSE 50 % IV SOLN
1.0000 | INTRAVENOUS | Status: AC
  Administered 2015-08-16: 50 mL via INTRAVENOUS

## 2015-08-16 MED ORDER — ATORVASTATIN CALCIUM 10 MG PO TABS: 10.0000 mg | ORAL_TABLET | Freq: Every day | ORAL | Status: DC

## 2015-08-16 MED ORDER — MEGESTROL ACETATE 40 MG PO TABS: 80.0000 mg | ORAL_TABLET | Freq: Two times a day (BID) | ORAL | Status: DC

## 2015-08-16 MED ORDER — TOPIRAMATE 25 MG PO TABS: 25.0000 mg | ORAL_TABLET | Freq: Two times a day (BID) | ORAL | Status: DC

## 2015-08-16 NOTE — Progress Notes (Signed)
Pharmacy Antibiotic Note  Sherry Ramsey is a 35 y.o. female admitted on 08/13/2015 with pneumonia.  Pharmacy has been consulted for vancomycin, Zosyn, and Levaquin dosing.  Plan: Will continue current antibiotics for now.   Continue Vancomycin 1250 mg IV q8 hours.Trough level scheduled for 4/23 @ 01:00.   Continue Zosyn 4.5 g IV q 8 h EI.   Continue Levaquin 750 mg iv q 24 hours.     Height: 5\' 4"  (162.6 cm) Weight: (!) 542 lb (245.85 kg) IBW/kg (Calculated) : 54.7  Temp (24hrs), Avg:98.5 F (36.9 C), Min:97.9 F (36.6 C), Max:99 F (37.2 C)   Recent Labs Lab 08/13/15 0627 08/13/15 0838 08/13/15 1147 08/14/15 0453 08/14/15 0621 08/14/15 1530 08/15/15 1504 08/16/15 0525  WBC 21.2*  --   --   --  26.4*  --  24.6* 17.4*  CREATININE 0.93  --   --  1.09*  --   --  0.92 0.89  LATICACIDVEN  --  0.6 0.8  --   --   --   --   --   VANCOTROUGH  --   --   --   --   --  21*  --   --     Estimated Creatinine Clearance: 181 mL/min (by C-G formula based on Cr of 0.89).    Allergies  Allergen Reactions  . Ibuprofen Other (See Comments)    Reaction: unknown    Antimicrobials this admission: Levaquin 4/19 >>  Vancomycin  4/19 >>  Zosyn 4/19 >>  Dose adjustments this admission:   Microbiology results: Sputum: MRSA, GNR ID & Sens pending MRSA PCR: positive 4/19 Blood cx: NGTD x 2  Thank you for allowing pharmacy to be a part of this patient's care.  Avonna Iribe C 08/16/2015 10:28 AM

## 2015-08-16 NOTE — Progress Notes (Signed)
Nutrition Follow-up  DOCUMENTATION CODES:   Morbid obesity  INTERVENTION:   -Recommend initiation of nutrition support when medically able, will continue to follow poc. Spoke with MD, Kasa this am, pt currently without access for nutrition support, plan to continue to hold off at this time. Per Nsg note NG tube was attempted times 2 yesterday unsuccessfully. TF orders not active.   NUTRITION DIAGNOSIS:   Inadequate oral intake related to acute illness as evidenced by NPO status.  GOAL:   Provide needs based on ASPEN/SCCM guidelines  MONITOR:   TF tolerance, Vent status, Labs, Weight trends, I & O's, Skin  REASON FOR ASSESSMENT:   Low Braden, Ventilator    ASSESSMENT:      35 yo female admitted with respiratory distress with hx of chronic trach due to OSA/COPD/CHF and symptomatic anemia with chronic vaginal bleeding diagnosed with endometrial cancer on 07/31/15   Pt currently sedated on the vent via the trach. NG tube unable to be placed yesterday.  Diet Order:  Diet NPO time specified NPO day 3  Gastrointestinal Profile: Last BM:  4/20 large soft yellow BM, obese abdomen, hypoactive BS UOP: 249mL in past 24 hours documented  Medications: Lasix, SS novolog, Megace, Solumedrol, MVI, Protonix, Senokot, Fentanyl, Propifol (provided 348kcals in past 24 hours) Labs: reviewed   Weight Trend since Admission: Filed Weights   08/13/15 0625  Weight: 542 lb (245.85 kg)    Skin:  Reviewed, no issues   Ideal Body Weight:  60 kg  BMI:  Body mass index is 92.99 kg/(m^2).  Estimated Nutritional Needs:   Kcal:  1320-1500 kcals (22-25 kcals/kg IBW per APEN guidelines for BMI >50)  Protein:  >/= 150 g (2.5 g/kg)   Fluid:  >/= 1.5 L per day  EDUCATION NEEDS:   No education needs identified at this time  Dwyane Luo, RD, LDN Pager [phone] Weekend/On-Call Pager [phone]

## 2015-08-16 NOTE — Progress Notes (Signed)
Critical ABG results given to M. Patria Mane, NP. No new orders given at this time.

## 2015-08-16 NOTE — Progress Notes (Signed)
PULMONARY / CRITICAL CARE MEDICINE   Name: Sherry Ramsey MRN: [id number] DOB: 1981-02-13    ADMISSION DATE:  08/13/2015  BRIEF HISTORY: 35 year old female with a known history of COPD, just of heart failure, presented to the ER with main complaint of respiratory distress/shortness of breath/fever/not feeling well for the past 2 weeks. She has a chronic trach secondary to OSA/COPD/CHF/morbid obesity. She states over the last 2 weeks she's been having moderate secretions from her trach site, thick yellow to green sputum production. She admits to intermittent fevers and chills. In the ER she was found to be tachycardic, tachypneic, and she is without elevated white count. Given chronic trach and thick secretions, pulmonary was consulted for further recommendations.  SUBJECTIVE:  No acute issues overnight. Remains on full vent support. Awakens to noxious stimulus. Increased secretions on suction. Remains hypercarbic on this morning's ABG: pH 7.34, PCO2 91, and PO2 74.  VITAL SIGNS: Temp:  [97.9 F (36.6 C)-99 F (37.2 C)] 97.9 F (36.6 C) (04/21 1930) Pulse Rate:  [88-121] 90 (04/22 0400) Resp:  [20-34] 24 (04/22 0400) BP: (98-146)/(54-110) 119/74 mmHg (04/22 0400) SpO2:  [88 %-100 %] 100 % (04/22 0410) FiO2 (%):  [30 %-40 %] 40 % (04/22 0410) HEMODYNAMICS:   VENTILATOR SETTINGS: Vent Mode:  [-] PRVC FiO2 (%):  [30 %-40 %] 40 % Set Rate:  [16 bmp-22 bmp] 22 bmp Vt Set:  [400 mL-450 mL] 400 mL PEEP:  [5 cmH20] 5 cmH20 Pressure Support:  [10 cmH20] 10 cmH20 Plateau Pressure:  [25 cmH20] 25 cmH20 INTAKE / OUTPUT:  Intake/Output Summary (Last 24 hours) at 08/16/15 0550 Last data filed at 08/16/15 0438  Gross per 24 hour  Intake 1376.28 ml  Output   2425 ml  Net -1048.72 ml    Review of Systems  Unable to perform ROS: critical illness    Physical Exam  Constitutional: She is well-developed, well-nourished, and in no distress.  Morbidly obese  HENT:  Head: Normocephalic and  atraumatic.  Right Ear: External ear normal.  Left Ear: External ear normal.  Neck: Normal range of motion. Neck supple. No thyromegaly present.  Cardiovascular: Normal rate, regular rhythm, normal heart sounds and intact distal pulses.   Pulmonary/Chest: She has no rales.  Coarse rhonchi in upper airway sounds, decreased basilar sounds, no expiratory and inspiratory wheezes  Abdominal: Soft. Bowel sounds are normal.  Musculoskeletal: Normal range of motion.  Neurological:  gcs<8T  Skin: Skin is warm and dry.  Nursing note and vitals reviewed.    LABS:  CBC  Recent Labs Lab 08/13/15 0627 08/14/15 0621 08/15/15 1504  WBC 21.2* 26.4* 24.6*  HGB 8.0* 7.8* 8.1*  HCT 26.4* 25.5* 27.0*  PLT 487* 459* 472*   Coag's No results for input(s): APTT, INR in the last 168 hours. BMET  Recent Labs Lab 08/13/15 0627 08/14/15 0453 08/15/15 1504  NA 135 139 138  K 4.1 5.0 4.5  CL 97* 99* 94*  CO2 33* 33* 42*  BUN 25* 21* 27*  CREATININE 0.93 1.09* 0.92  GLUCOSE 131* 134* 109*   Electrolytes  Recent Labs Lab 08/13/15 0627 08/14/15 0453 08/15/15 1504  CALCIUM 8.8* 9.0 8.9  MG  --   --  2.2  PHOS  --   --  2.7   Sepsis Markers  Recent Labs Lab 08/13/15 0838 08/13/15 1147  LATICACIDVEN 0.6 0.8   ABG  Recent Labs Lab 08/14/15 0934 08/15/15 1352 08/16/15 0416  PHART 7.23* 7.31* 7.34*  PCO2ART 95* 94* 91*  PO2ART 90 59* 74*   Liver Enzymes No results for input(s): AST, ALT, ALKPHOS, BILITOT, ALBUMIN in the last 168 hours. Cardiac Enzymes  Recent Labs Lab 08/13/15 0627 08/13/15 1147 08/13/15 1524  TROPONINI 0.04* 0.05* 0.04*   Glucose  Recent Labs Lab 08/13/15 1056 08/16/15 0519  GLUCAP 106* 74    Imaging No results found.  Cultures: BCx2  UC  Sputum 4/19 MRSA positive  Antibiotics: Levaquin 4/19>> Zosyn 4/19>>  Lines: -PIVs and ETT   Discussion: 35 year old female past medical history of COPD, OSA status post tracheostomy,  morbid obesity, congestive heart failure (grade 1 diastolic) our cardiomyopathy, seen in consultation for shortness of breath   ASSESSMENT / PLAN:  PULMONARY A: Acute on Chronic Hypercapnic respiratory failureDyspnea Chronic tracheostomy Obesity hypoventilation syndrome Possible viral syndrome/bronchitis versus HCAP P:   -Full vent support with current settings -Daily ABG -Nebulized bronchodilator and steriods -Daily CXR -Sputum culture -Empiric antibiotics -Continue solu-medrol and taper   CARDIOVASCULAR A:  Cardiomegaly Congestive heart failure-last echo 4/10 with LVEF of 65% and grade 1 diastolic dysfunction HTN HLD  P:  -IV diuresis -Continue coreg -Hold lisinopril -Hydralazine prn for SBP>160  RENAL A:   No acute issues P:   -Foley to gravity -Monitor I/O -Monitor and replace electrolytes  GASTROINTESTINAL A:   No acute issues P:   -PPI for stress ulcer phrophylaxis  HEMATOLOGIC A:   Vaginal bleed secondary to endometrial adenocarcinoma Anemia - blood loss, due to vaginal bleeding P:  -Already seen by gynecology, started on appropriate medications(megace).    -Continue megace -Follow up as an outpatient with [facility]  INFECTIOUS A:   HCAP versus bronchitis in a high risk trached patient P:   -Empiric antibiotics -F/U cultures  ENDOCRINE A:   Type 2 DM P:   -Blood glucose monitoring with SSI coverage  Genitourinary A:  Endometerial Adenocarcinoma  NEUROLOGIC A:   Vent sedation P:   RASS goal: -1 to -2 -Propofol, fentanyl and versed for sedation and comfort  Disposition and Family update: Plan for [facility] referral initiated 04/21. No family at bedside during am rounds.    Best Practice: Code Status: Full Diet: NPO GI prophylaxis: PPI VTE prophylaxis:  SCD's /no pharmacologic VTE prophylaxis due to vaginal bleeding  Magdalene S. [facility] ANP-BC Pulmonary and [place] Pager  [phone]  08/16/2015 AT 05:53

## 2015-08-17 ENCOUNTER — Inpatient Hospital Stay: Payer: Medicaid Other

## 2015-08-17 LAB — CBC
HEMATOCRIT: 24.2 % — AB (ref 35.0–47.0)
HEMOGLOBIN: 7.5 g/dL — AB (ref 12.0–16.0)
MCH: 25.4 pg — AB (ref 26.0–34.0)
MCHC: 30.7 g/dL — AB (ref 32.0–36.0)
MCV: 82.5 fL (ref 80.0–100.0)
Platelets: 355 10*3/uL (ref 150–440)
RBC: 2.94 MIL/uL — AB (ref 3.80–5.20)
RDW: 18 % — ABNORMAL HIGH (ref 11.5–14.5)
WBC: 18.3 10*3/uL — ABNORMAL HIGH (ref 3.6–11.0)

## 2015-08-17 LAB — BASIC METABOLIC PANEL
ANION GAP: 6 (ref 5–15)
BUN: 24 mg/dL — ABNORMAL HIGH (ref 6–20)
CALCIUM: 8.6 mg/dL — AB (ref 8.9–10.3)
CO2: 42 mmol/L — ABNORMAL HIGH (ref 22–32)
CREATININE: 0.85 mg/dL (ref 0.44–1.00)
Chloride: 93 mmol/L — ABNORMAL LOW (ref 101–111)
GFR calc Af Amer: >60 mL/min (ref >60)
GLUCOSE: 114 mg/dL — AB (ref 65–99)
Potassium: 4.3 mmol/L (ref 3.5–5.1)
Sodium: 141 mmol/L (ref 135–145)

## 2015-08-17 LAB — GLUCOSE, CAPILLARY
GLUCOSE-CAPILLARY: 112 mg/dL — AB (ref 65–99)
GLUCOSE-CAPILLARY: 118 mg/dL — AB (ref 65–99)
Glucose-Capillary: 102 mg/dL — ABNORMAL HIGH (ref 65–99)
Glucose-Capillary: 113 mg/dL — ABNORMAL HIGH (ref 65–99)
Glucose-Capillary: 134 mg/dL — ABNORMAL HIGH (ref 65–99)
Glucose-Capillary: 149 mg/dL — ABNORMAL HIGH (ref 65–99)

## 2015-08-17 LAB — VANCOMYCIN, TROUGH: Vancomycin Tr: 9 ug/mL — ABNORMAL LOW (ref 10–20)

## 2015-08-17 MED ORDER — VANCOMYCIN HCL 10 G IV SOLR
1500.0000 mg | Freq: Three times a day (TID) | INTRAVENOUS | Status: DC
  Administered 2015-08-17: 1500 mg via INTRAVENOUS
  Filled 2015-08-17 (×3): qty 1500

## 2015-08-17 MED ORDER — VANCOMYCIN HCL 10 G IV SOLR
1500.0000 mg | Freq: Two times a day (BID) | INTRAVENOUS | Status: DC
  Administered 2015-08-17 – 2015-08-20 (×6): 1500 mg via INTRAVENOUS
  Filled 2015-08-17 (×11): qty 1500

## 2015-08-17 NOTE — Progress Notes (Signed)
PULMONARY / CRITICAL CARE MEDICINE   Name: Sherry Ramsey MRN: [id number] DOB: 11-25-1980    ADMISSION DATE:  08/13/2015  BRIEF HISTORY: 35 year old female with a known history of COPD, just of heart failure, presented to the ER with main complaint of respiratory distress/shortness of breath/fever/not feeling well for the past 2 weeks. She has a chronic trach secondary to OSA/COPD/CHF/morbid obesity. She states over the last 2 weeks she's been having moderate secretions from her trach site, thick yellow to green sputum production. She admits to intermittent fevers and chills. In the ER she was found to be tachycardic, tachypneic, and she is without elevated white count. Given chronic trach and thick secretions, pulmonary was consulted for further recommendations.  SUBJECTIVE:  No acute issues overnight. Remains on full vent support and sedated. Copious secretions in oral cavity Will attempt PS mode today,wean sedation as tolerated  VITAL SIGNS: Temp:  [98.8 F (37.1 C)-100 F (37.8 C)] 99.7 F (37.6 C) (04/23 0000) Pulse Rate:  [86-104] 95 (04/23 0000) Resp:  [14-32] 26 (04/23 0000) BP: (62-126)/(33-86) 126/77 mmHg (04/23 0000) SpO2:  [61 %-100 %] 98 % (04/23 0000) FiO2 (%):  [40 %] 40 % (04/23 0319) HEMODYNAMICS:   VENTILATOR SETTINGS: Vent Mode:  [-] PRVC FiO2 (%):  [40 %] 40 % Set Rate:  [22 bmp] 22 bmp Vt Set:  [400 mL] 400 mL PEEP:  [5 cmH20] 5 cmH20 Plateau Pressure:  [17 cmH20-24 cmH20] 17 cmH20 INTAKE / OUTPUT:  Intake/Output Summary (Last 24 hours) at 08/17/15 0420 Last data filed at 08/16/15 2343  Gross per 24 hour  Intake 489.99 ml  Output   4225 ml  Net -3735.01 ml    Review of Systems  Unable to perform ROS: critical illness  Neurological:       Withdraws to pain    Physical Exam  Constitutional: She is well-developed, well-nourished, and in no distress.  Morbidly obese  HENT:  Head: Normocephalic and atraumatic.  Right Ear: External ear normal.  Left  Ear: External ear normal.  Neck: Normal range of motion. Neck supple. No thyromegaly present.  Cardiovascular: Normal rate, regular rhythm, normal heart sounds and intact distal pulses.   Pulmonary/Chest: She has no rales.  Coarse rhonchi in upper airway sounds, decreased basilar sounds, no expiratory and inspiratory wheezes  Abdominal: Soft. Bowel sounds are normal.  Musculoskeletal: Normal range of motion.  Neurological:  gcs<8T  Skin: Skin is warm and dry.  Nursing note and vitals reviewed.    LABS:  CBC  Recent Labs Lab 08/14/15 0621 08/15/15 1504 08/16/15 0525  WBC 26.4* 24.6* 17.4*  HGB 7.8* 8.1* 7.3*  HCT 25.5* 27.0* 24.2*  PLT 459* 472* 381   Coag's No results for input(s): APTT, INR in the last 168 hours. BMET  Recent Labs Lab 08/14/15 0453 08/15/15 1504 08/16/15 0525  NA 139 138 140  K 5.0 4.5 4.1  CL 99* 94* 94*  CO2 33* 42* 41*  BUN 21* 27* 29*  CREATININE 1.09* 0.92 0.89  GLUCOSE 134* 109* 83   Electrolytes  Recent Labs Lab 08/14/15 0453 08/15/15 1504 08/16/15 0525  CALCIUM 9.0 8.9 8.5*  MG  --  2.2 2.0  PHOS  --  2.7 2.9   Sepsis Markers  Recent Labs Lab 08/13/15 0838 08/13/15 1147  LATICACIDVEN 0.6 0.8   ABG  Recent Labs Lab 08/14/15 0934 08/15/15 1352 08/16/15 0416  PHART 7.23* 7.31* 7.34*  PCO2ART 95* 94* 91*  PO2ART 90 59* 74*   Liver Enzymes  No results for input(s): AST, ALT, ALKPHOS, BILITOT, ALBUMIN in the last 168 hours. Cardiac Enzymes  Recent Labs Lab 08/13/15 0627 08/13/15 1147 08/13/15 1524  TROPONINI 0.04* 0.05* 0.04*   Glucose  Recent Labs Lab 08/16/15 0519 08/16/15 0729 08/16/15 1205 08/16/15 2011 08/16/15 2334 08/17/15 0355  GLUCAP 74 88 69 137* 125* 102*    Imaging Dg Chest Port 1 View  08/16/2015  CLINICAL DATA:  Acute respiratory failure.  Subsequent encounter. EXAM: PORTABLE CHEST 1 VIEW COMPARISON:  08/13/2015 FINDINGS: Mild enlargement of the cardiopericardial silhouette, stable.  Low lung volumes. This accentuates the appearance of central vascular congestion and interstitial thickening. No convincing pneumonia. No obvious pleural effusion or pneumothorax. Tracheostomy tube is stable and well positioned. IMPRESSION: 1. No significant change from the prior exam. 2. Possible mild pulmonary edema/congestive heart failure. Findings are accentuated by the patient's body habitus and low lung volumes. No evidence of pneumonia. Electronically Signed   By: Lajean Manes M.D.   On: 08/16/2015 07:33    Cultures: BCx2  UC  Sputum 4/19 MRSA positive  Antibiotics: Levaquin 4/19>> Zosyn 4/19>>  Lines: -PIVs and ETT   Discussion: 35 year old female past medical history of COPD, OSA status post tracheostomy, morbid obesity, congestive heart failure (grade 1 diastolic) our cardiomyopathy, seen in consultation for shortness of breath-resp failure   ASSESSMENT / PLAN:  PULMONARY A: Acute on Chronic Hypercapnic respiratory failureDyspnea Chronic tracheostomy Obesity hypoventilation syndrome Possible viral syndrome/bronchitis versus HCAP P:   -plan for PS mode -Daily ABG -Nebulized bronchodilator and steriods -Daily CXR -f/u Sputum culture -Empiric antibiotics -Continue solu-medrol and taper  -SBT/PSV trials this am  CARDIOVASCULAR A:  Cardiomegaly Congestive heart failure-last echo 4/10 with LVEF of 65% and grade 1 diastolic dysfunction HTN HLD  P:  -continue IV diuresis -Continue coreg -Hold lisinopril -Hydralazine prn for SBP>160  RENAL A:   No acute issues P:   -Foley to gravity -Monitor I/O -Monitor and replace electrolytes  GASTROINTESTINAL A:   No acute issues P:   -PPI for stress ulcer phrophylaxis  HEMATOLOGIC A:   Vaginal bleed secondary to endometrial adenocarcinoma Anemia - blood loss, due to vaginal bleeding P:  -Already seen by gynecology, started on appropriate medications(megace).    -Continue megace -Follow up as an  outpatient with [facility]  INFECTIOUS A:   HCAP versus bronchitis in a high risk trached patient P:   -Empiric antibiotics -F/U cultures-negative to date  ENDOCRINE A:   Type 2 DM P:   -Blood glucose monitoring with SSI coverage  Genitourinary A:  Endometerial Adenocarcinoma  NEUROLOGIC A:   Vent sedation P:   RASS goal: -1 to -2 -Propofol, fentanyl and versed for sedation and comfort  Disposition and Family update: Plan for [facility] referral initiated 04/21. No family at bedside    Best Practice: Code Status: Full Diet: NPO GI prophylaxis: PPI VTE prophylaxis:  SCD's /no pharmacologic VTE prophylaxis due to vaginal bleeding   total CC time is 35 minutes  Magdalene S. [facility] ANP-BC Pulmonary and [place] Pager [phone]  08/16/2015 AT 05:53  STAFF NOTE: I, Dr. Corrin Parker,  have personally reviewed patient's available data, including medical history, events of note, physical examination and test results as part of my evaluation. I have discussed with NP and other care providers such as pharmacist, RN and RRT.  In addition,  I personally evaluated patient and elicited key findings  +rhonchi   A:morbidly obese with resp failure  P: wean to  PS mode today, wean sedation     The Rest per NP whose note is outlined above and that I agree with  I have personally reviewed/obtained a history, examined the patient, evaluated Pertinent laboratory and [employer]/imaging results, and  formulated the assessment and plan   The Patient requires high complexity decision making for assessment and support, frequent evaluation and titration of therapies, application of advanced monitoring technologies and extensive interpretation of multiple databases. Critical Care Time devoted to patient care services described in this note is 35 minutes.  This Critical care time does not reflrect procedure time or supervisory time of NP but could  involve care discussion time Overall, patient is critically ill, prognosis is guarded.  Corrin Parker, M.D.  [facility]  Medical Director [place] Director [facility] Cardio-Pulmonary Department

## 2015-08-17 NOTE — Progress Notes (Signed)
Pt lying in bed cont.onvent. Pt remains sedated pt does follow commands . Pt has no changes in care noted. Further assesment per flowsheet

## 2015-08-17 NOTE — Progress Notes (Signed)
Pharmacy Antibiotic Note  Sherry Ramsey is a 35 y.o. female admitted on 08/13/2015 with pneumonia.  Pharmacy has been consulted for vancomycin, Zosyn, and Levaquin dosing.  Plan: Will continue current antibiotics for now.  Current orders for vancomycin 1500mg  IV Q8H and zosyn 4.5gm IV Q8H extended infusion.  Vancomycin dose adjsuted this AM for trough of 57mcg/ml however, per MAR vancomycin dose due at 1730 on 4/22 not given, thus it had been 16.5 hours since last dose of vancomycin. Patient is at high risk for accumulation of drug due to obesity and subsequent renal insult due to concurrent use of high doses of zosyn. Will redose based on estimated PK parameters to 1500mg  IV Q12H. Will check trough prior to 4th dose, which will not be quite at steady sate.    Height: 5\' 4"  (162.6 cm) Weight: (!) 542 lb (245.85 kg) IBW/kg (Calculated) : 54.7  Temp (24hrs), Avg:99.7 F (37.6 C), Min:98.8 F (37.1 C), Max:100 F (37.8 C)   Recent Labs Lab 08/13/15 0627 08/13/15 0838 08/13/15 1147 08/14/15 0453 08/14/15 0621 08/14/15 1530 08/15/15 1504 08/16/15 0525 08/17/15 0050 08/17/15 0459  WBC 21.2*  --   --   --  26.4*  --  24.6* 17.4*  --  18.3*  CREATININE 0.93  --   --  1.09*  --   --  0.92 0.89  --  0.85  LATICACIDVEN  --  0.6 0.8  --   --   --   --   --   --   --   VANCOTROUGH  --   --   --   --   --  21*  --   --  9*  --     Estimated Creatinine Clearance: 189.5 mL/min (by C-G formula based on Cr of 0.85).    Allergies  Allergen Reactions  . Ibuprofen Other (See Comments)    Reaction: unknown    Antimicrobials this admission: Levaquin 4/19 >>  Vancomycin  4/19 >>  Zosyn 4/19 >>  Dose adjustments this admission: 4/20 Trough: 21mg c/ml Vancomycin 1500 Q8H -> 1250 Q8H 4/23 0100 vanc level 9. Changed to 1500 mg q 8 hours. Level before 4th new dose. Increasing cautiously due to possibility of accumulation. 4/23: see above, will redose at 1500mg  IV Q12H   Microbiology  results: Sputum: MRSA, GNR ID & Sens pending MRSA PCR: positive 4/19 Blood cx: NGTD x 2  Thank you for allowing pharmacy to be a part of this patient's care.  Soma Bachand C 08/17/2015 10:35 AM

## 2015-08-17 NOTE — Progress Notes (Addendum)
Pharmacy Antibiotic Note  Sherry Ramsey is a 35 y.o. female admitted on 08/13/2015 with pneumonia.  Pharmacy has been consulted for vancomycin, Zosyn, and Levaquin dosing.  Plan: Will continue current antibiotics for now.   Continue Vancomycin 1250 mg IV q8 hours.Trough level scheduled for 4/23 @ 01:00.   Continue Zosyn 4.5 g IV q 8 h EI.   Continue Levaquin 750 mg iv q 24 hours.     Height: 5\' 4"  (162.6 cm) Weight: (!) 542 lb (245.85 kg) IBW/kg (Calculated) : 54.7  Temp (24hrs), Avg:99.6 F (37.6 C), Min:98.8 F (37.1 C), Max:100 F (37.8 C)   Recent Labs Lab 08/13/15 0627 08/13/15 0838 08/13/15 1147 08/14/15 0453 08/14/15 0621 08/14/15 1530 08/15/15 1504 08/16/15 0525 08/17/15 0050  WBC 21.2*  --   --   --  26.4*  --  24.6* 17.4*  --   CREATININE 0.93  --   --  1.09*  --   --  0.92 0.89  --   LATICACIDVEN  --  0.6 0.8  --   --   --   --   --   --   VANCOTROUGH  --   --   --   --   --  21*  --   --  9*    Estimated Creatinine Clearance: 181 mL/min (by C-G formula based on Cr of 0.89).    Allergies  Allergen Reactions  . Ibuprofen Other (See Comments)    Reaction: unknown    Antimicrobials this admission: Levaquin 4/19 >>  Vancomycin  4/19 >>  Zosyn 4/19 >>  Dose adjustments this admission:  4/23 0100 vanc level 9. Changed to 1500 mg q 8 hours. Level before 4th new dose. Increasing cautiously due to possibility of accumulation.    Microbiology results: Sputum: MRSA, GNR ID & Sens pending MRSA PCR: positive 4/19 Blood cx: NGTD x 2  Thank you for allowing pharmacy to be a part of this patient's care.  Dallis Darden S 08/17/2015 2:55 AM

## 2015-08-18 ENCOUNTER — Inpatient Hospital Stay: Payer: Medicaid Other

## 2015-08-18 LAB — BLOOD GAS, ARTERIAL
Acid-Base Excess: 10.5 mmol/L — ABNORMAL HIGH (ref 0.0–3.0)
Acid-Base Excess: 24.7 mmol/L — ABNORMAL HIGH (ref 0.0–3.0)
Allens test (pass/fail): POSITIVE — AB
BICARBONATE: 39.8 meq/L — AB (ref 21.0–28.0)
BICARBONATE: 53.6 meq/L — AB (ref 21.0–28.0)
FIO2: 0.4
FIO2: 40
MECHVT: 400 mL
Mechanical Rate: 22
O2 SAT: 94 %
O2 Saturation: 95.2 %
PATIENT TEMPERATURE: 37
PATIENT TEMPERATURE: 37
PCO2 ART: 97 mmHg — AB (ref 32.0–48.0)
PO2 ART: 74 mmHg — AB (ref 83.0–108.0)
pCO2 arterial: 95 mmHg (ref 32.0–48.0)
pH, Arterial: 7.23 — ABNORMAL LOW (ref 7.350–7.450)
pH, Arterial: 7.35 (ref 7.350–7.450)
pO2, Arterial: 90 mmHg (ref 83.0–108.0)

## 2015-08-18 LAB — BASIC METABOLIC PANEL
ANION GAP: 5 (ref 5–15)
BUN: 25 mg/dL — AB (ref 6–20)
CHLORIDE: 94 mmol/L — AB (ref 101–111)
CO2: 43 mmol/L — AB (ref 22–32)
Calcium: 8.7 mg/dL — ABNORMAL LOW (ref 8.9–10.3)
Creatinine, Ser: 0.81 mg/dL (ref 0.44–1.00)
GFR calc Af Amer: >60 mL/min (ref >60)
GFR calc non Af Amer: >60 mL/min (ref >60)
GLUCOSE: 115 mg/dL — AB (ref 65–99)
POTASSIUM: 4.4 mmol/L (ref 3.5–5.1)
Sodium: 142 mmol/L (ref 135–145)

## 2015-08-18 LAB — CBC
HEMATOCRIT: 24.1 % — AB (ref 35.0–47.0)
HEMOGLOBIN: 7.3 g/dL — AB (ref 12.0–16.0)
MCH: 24.9 pg — AB (ref 26.0–34.0)
MCHC: 30.4 g/dL — ABNORMAL LOW (ref 32.0–36.0)
MCV: 81.8 fL (ref 80.0–100.0)
PLATELETS: 369 10*3/uL (ref 150–440)
RBC: 2.94 MIL/uL — AB (ref 3.80–5.20)
RDW: 18.2 % — ABNORMAL HIGH (ref 11.5–14.5)
WBC: 18.9 10*3/uL — AB (ref 3.6–11.0)

## 2015-08-18 LAB — GLUCOSE, CAPILLARY
GLUCOSE-CAPILLARY: 119 mg/dL — AB (ref 65–99)
GLUCOSE-CAPILLARY: 108 mg/dL — AB (ref 65–99)
GLUCOSE-CAPILLARY: 85 mg/dL (ref 65–99)
Glucose-Capillary: 91 mg/dL (ref 65–99)
Glucose-Capillary: 85 mg/dL (ref 65–99)

## 2015-08-18 LAB — TRIGLYCERIDES: Triglycerides: 243 mg/dL — ABNORMAL HIGH (ref <150)

## 2015-08-18 LAB — MAGNESIUM: Magnesium: 2.5 mg/dL — ABNORMAL HIGH (ref 1.7–2.4)

## 2015-08-18 LAB — PHOSPHORUS: Phosphorus: 3.8 mg/dL (ref 2.5–4.6)

## 2015-08-18 MED ORDER — FAMOTIDINE 20 MG PO TABS: 20.0000 mg | ORAL_TABLET | Freq: Two times a day (BID) | ORAL | Status: DC

## 2015-08-18 MED ORDER — VITAL HIGH PROTEIN PO LIQD
1000.0000 mL | ORAL | Status: DC
  Administered 2015-08-18: 1000 mL

## 2015-08-18 MED ORDER — PIPERACILLIN-TAZOBACTAM 3.375 G IVPB
3.3750 g | Freq: Three times a day (TID) | INTRAVENOUS | Status: DC
  Administered 2015-08-18 – 2015-08-20 (×6): 3.375 g via INTRAVENOUS
  Filled 2015-08-18 (×7): qty 50

## 2015-08-18 MED ORDER — PREDNISONE 20 MG PO TABS: 50.0000 mg | ORAL_TABLET | Freq: Every day | ORAL | Status: DC

## 2015-08-18 NOTE — Plan of Care (Signed)
Problem: Nutrition: Goal: Adequate nutrition will be maintained Outcome: Not Progressing Unable to pass NG/OG. Will start feeds when able.

## 2015-08-18 NOTE — Progress Notes (Signed)
Vancomycin administered this evening at 2147, trough drawn at 2152. Level not a true trough. Contacted lab to credit and reordered level before next dose.  Danyle Boening A. [place], [place].D., BCPS Clinical Pharmacist 08/18/2015 2252

## 2015-08-18 NOTE — Progress Notes (Signed)
1800 Tolerated ventilator and sedation well. Ng placed in left nare for feeding. Auscalitated but KUB done for placedment. Sedated with Diprivan and Fentanyl today. Very difficult to move patient due to size and lift still required 3_+ people to effectively move patient.

## 2015-08-18 NOTE — Care Management (Addendum)
CM ask to look at patient for [facility]. Not a candidate due to medicaid. Vent weaning in progress.

## 2015-08-18 NOTE — Progress Notes (Signed)
Nutrition Follow-up  DOCUMENTATION CODES:   Morbid obesity  INTERVENTION:  -Discussed nutritional poc during ICU rounds with MD Ashby Dawes; received orders for insertion of NG tube with initiation of TF. Recommend starting Adult Tube Feeding Protocol wit Vital High Protein at rate of 20 with initial goal of 40 ml/hr; TF to begin once NG tube placed.  Pt currently receiving additional kcals from diprivan; if NG placed and pt tolerating TF, will reassess goal rate on follow-up  NUTRITION DIAGNOSIS:   Inadequate oral intake related to acute illness as evidenced by NPO status.  GOAL:   Provide needs based on ASPEN/SCCM guidelines  MONITOR:   TF tolerance, Vent status, Labs, Weight trends, I & O's, Skin  REASON FOR ASSESSMENT:   Low Braden, Ventilator    ASSESSMENT:   35 yo female admitted with respiratory distress with hx of chronic trach due to OSA/COPD/CHF and symptomatic anemia with chronic vaginal bleeding diagnosed with endometrial cancer on 07/31/15.   Pt remains on vent support via trach, no NG or OG, pt on fentanyl and diprivan for sedation (1323 kcals in past 24 hours per I/O flow sheet)  Diet Order:  Diet NPO time specified  Skin:  Reviewed, no issues  Last BM:  4/21   Labs: reviewed  Meds: ss novolog, lasix, diprivan, fentanyl  Height:   Ht Readings from Last 1 Encounters:  08/13/15 5\' 4"  (1.626 m)    Weight:   Wt Readings from Last 1 Encounters:  08/13/15 542 lb (245.85 kg)    Ideal Body Weight:  60 kg  BMI:  Body mass index is 92.99 kg/(m^2).  Estimated Nutritional Needs:   Kcal:  1320-1500 kcals (22-25 kcals/kg IBW per APEN guidelines for BMI >50)  Protein:  >/= 150 g (2.5 g/kg)   Fluid:  >/= 1.5 L per day  EDUCATION NEEDS:   No education needs identified at this time  [place], [place], [place] [phone] Pager  [phone] Weekend/On-Call Pager

## 2015-08-18 NOTE — Progress Notes (Signed)
Pharmacy Antibiotic Note  Sherry Ramsey is a 35 y.o. female admitted on 08/13/2015 with pneumonia.  Pharmacy has been consulted to dose vancomycin and piperacillin/tazobactam.   This is day #6 of antibiotics and patient has been on vancomycin, piperacillin/tazobactam, and levofloxacin. Respiratory culture is growing MRSA and providencia. Levofloxacin discontinued today based on culture (providencia resistant to FQ but sensitive to piperacillin/tazobactam).  Plan: Continue zosyn 4.5gm IV Q8H extended infusion.  Continue vancomycin 1500 mg IV q12h. Vancomycin trough ordered for 2130 this evening, which is prior to 4th dose on new regimen. Patient will not quite be at steady state prior to 4th dose but due to risk for accumulation, want to ensure patient is not supratherapeutic.  Height: 5\' 4"  (162.6 cm) Weight:  (bed scale broken) IBW/kg (Calculated) : 54.7  Temp (24hrs), Avg:100 F (37.8 C), Min:99.8 F (37.7 C), Max:100.3 F (37.9 C)   Recent Labs Lab 08/13/15 0838 08/13/15 1147 08/14/15 0453 08/14/15 0621 08/14/15 1530 08/15/15 1504 08/16/15 0525 08/17/15 0050 08/17/15 0459 08/18/15 0431  WBC  --   --   --  26.4*  --  24.6* 17.4*  --  18.3* 18.9*  CREATININE  --   --  1.09*  --   --  0.92 0.89  --  0.85 0.81  LATICACIDVEN 0.6 0.8  --   --   --   --   --   --   --   --   VANCOTROUGH  --   --   --   --  21*  --   --  9*  --   --     Estimated Creatinine Clearance: 198.9 mL/min (by C-G formula based on Cr of 0.81).    Allergies  Allergen Reactions  . Ibuprofen Other (See Comments)    Reaction: unknown    Antimicrobials this admission: Levaquin 4/19 >> 4/24 Vancomycin  4/19 >>  Zosyn 4/19 >>  Dose adjustments this admission: 4/20 Trough: 21mg c/ml Vancomycin 1500 Q8H -> 1250 Q8H 4/23 0100 vanc level 9. Changed to 1500 mg q 8 hours. Level before 4th new dose. Increasing cautiously due to possibility of accumulation. 4/23: see above, will redose at 1500mg  IV  Q12H   Microbiology results: Sputum: MRSA, providencia MRSA PCR: positive 4/19 Blood cx: No growth 4 days  Thank you for allowing pharmacy to be a part of this patient's care.  Lenis Noon, PharmD Clinical Pharmacist 08/18/2015 11:42 AM

## 2015-08-18 NOTE — Progress Notes (Signed)
[facility] Progess Note    ASSESSMENT/PLAN   35 year old female with morbid obesity, chronically trach dependent, now with acute MRSA bronchitis and hypercapnic respiratory failure-acute on chronic.  PULMONARY A: Acute on Chronic Hypercapnic respiratory failure Chronic tracheostomy Obesity hypoventilation syndrome Possible viral syndrome/bronchitis versus HCAP P:  -Currently on tidal volume 400/22/5/40%; plan for PS mode -ABG reviewed today appears to show severe hypercapnia, appears compensated on the ventilator. -Nebulized bronchodilator and steriods -Daily CXR -reviewed today, minimal change from previous days, can continued bibasilar atelectasis. -Empiric antibiotics -Continue solu-medrol and taper -currently on Solu-Medrol 60 mg once daily. -SBT/PSV trials this am  CARDIOVASCULAR A:  Cardiomegaly Congestive heart failure-last echo 4/10 with LVEF of 65% and grade 1 diastolic dysfunction HTN HLD  P:  -continue IV diuresis-now on Lasix 40 mg once daily. -Continue coreg -Hold lisinopril -Hydralazine prn for SBP>160, currently her blood pressure appears to be adequately controlled.  RENAL A:  No acute issues, creatinine normal. P:  -Foley to gravity -Monitor I/O -Monitor and replace electrolytes  GASTROINTESTINAL A:  No acute issues P:  -PPI for stress ulcer phrophylaxis  HEMATOLOGIC A:  Vaginal bleed secondary to endometrial adenocarcinoma Anemia - blood loss, due to vaginal bleeding P:  -Already seen by gynecology, started on appropriate medications(megace).  -Continue megace -Follow up as an outpatient with [facility] Gyn/Onc  INFECTIOUS A:  HCAP versus bronchitis in a high risk trached patient P:  -Empiric antibiotics  Antibiotics: Levaquin 4/19 >>  Vancomycin 4/19 >>  Zosyn 4/19 >>  Cultures: 4/19. Blood culture: Pending 4/19. MRSA screen: Positive 4/19. Sputum culture: MRSA  ENDOCRINE A:   Type 2 DM P:  -Blood glucose monitoring with SSI coverage, glucose, appears controlled today.   Genitourinary A:  Endometerial Adenocarcinoma  NEUROLOGIC A:  Vent sedation P:  RASS goal: -1 to -2 -Propofol, fentanyl and versed for sedation and comfort  Disposition and Family update: Plan for [facility] referral initiated 04/21. No family at bedside    Best Practice: Code Status: Full Diet: NPO GI prophylaxis: PPI-will change to an H2 blocker. VTE prophylaxis: SCD's /no pharmacologic VTE prophylaxis due to vaginal bleeding   ---------------------------------------   ----------------------------------------   Name: Sherry Ramsey MRN: [id number] DOB: [birth year]    ADMISSION DATE:  08/13/2015   SUBJECTIVE:   Pt currently on the ventilator, can not provide history or review of systems.    VITAL SIGNS: Temp:  [99.8 F (37.7 C)-100.3 F (37.9 C)] 99.9 F (37.7 C) (04/24 0400) Pulse Rate:  [87-107] 98 (04/24 0600) Resp:  [20-28] 22 (04/24 0600) BP: (79-119)/(51-87) 119/78 mmHg (04/24 0600) SpO2:  [93 %-99 %] 95 % (04/24 0600) FiO2 (%):  [40 %] 40 % (04/24 0135) HEMODYNAMICS:   VENTILATOR SETTINGS: Vent Mode:  [-] PRVC FiO2 (%):  [40 %] 40 % Set Rate:  [22 bmp] 22 bmp Vt Set:  [400 mL] 400 mL PEEP:  [5 cmH20] 5 cmH20 Plateau Pressure:  [20 cmH20] 20 cmH20 INTAKE / OUTPUT:  Intake/Output Summary (Last 24 hours) at 08/18/15 0758 Last data filed at 08/18/15 0600  Gross per 24 hour  Intake 2583.83 ml  Output   4000 ml  Net -1416.17 ml    PHYSICAL EXAMINATION: Physical Examination:   VS: BP 119/78 mmHg  Pulse 98  Temp(Src) 99.9 F (37.7 C) (Oral)  Resp 22  Ht 5\' 4"  (1.626 m)  Wt 542 lb (245.85 kg)  BMI 92.99 kg/m2  SpO2 95%  LMP 07/27/2015  General Appearance: No distress -super  obese. Neuro:without focal findings, mental status normal. HEENT: PERRLA, EOM intact. Pulmonary: normal breath sounds , decreased bilaterally.  CardiovascularNormal  S1,S2.  No m/r/g.   Abdomen: Benign, Soft, non-tender. Renal:  No costovertebral tenderness  GU:  Not performed at this time. Endocrine: No evident thyromegaly. Skin:   warm, no rashes, no ecchymosis  Extremities: normal, no cyanosis, clubbing.   LABS:   LABORATORY PANEL:   CBC  Recent Labs Lab 08/18/15 0431  WBC 18.9*  HGB 7.3*  HCT 24.1*  PLT 369    Chemistries   Recent Labs Lab 08/18/15 0431  NA 142  K 4.4  CL 94*  CO2 43*  GLUCOSE 115*  BUN 25*  CREATININE 0.81  CALCIUM 8.7*  MG 2.5*  PHOS 3.8     Recent Labs Lab 08/17/15 1345 08/17/15 1620 08/17/15 1949 08/17/15 2332 08/18/15 0406 08/18/15 0729  GLUCAP 134* 149* 118* 112* 119* 108*    Recent Labs Lab 08/15/15 1352 08/16/15 0416 08/18/15 0354  PHART 7.31* 7.34* 7.35  PCO2ART 94* 91* 97*  PO2ART 59* 74* 74*   No results for input(s): AST, ALT, ALKPHOS, BILITOT, ALBUMIN in the last 168 hours.  Cardiac Enzymes  Recent Labs Lab 08/13/15 1524  TROPONINI 0.04*    RADIOLOGY:  Dg Chest Port 1 View  08/18/2015  CLINICAL DATA:  Respiratory failure. EXAM: PORTABLE CHEST 1 VIEW COMPARISON:  08/17/2015. FINDINGS: Tracheostomy tube noted in stable position. Cardiomegaly with pulmonary vascular prominence and bilateral interstitial prominence consistent with congestive heart failure. Similar findings noted on prior exam small left pleural effusion cannot be excluded. No pneumothorax. IMPRESSION: 1. Tracheostomy tube in stable position. 2. Cardiomegaly with pulmonary vascular prominence and interstitial prominence again noted without interim change. Findings consistent congestive heart failure. Small left pleural effusion cannot be excluded. Electronically Signed   By: Marcello Moores  Register   On: 08/18/2015 07:28   Dg Chest Port 1 View  08/17/2015  CLINICAL DATA:  Acute respiratory failure with hypoxia. Chronic systolic congestive heart failure. EXAM: PORTABLE CHEST 1 VIEW COMPARISON:  08/16/2015 FINDINGS:  Technically suboptimal exam due to large patient habitus. Tracheostomy tube is seen in appropriate position. Cardiomegaly remains stable. Low lung volumes noted. Diffuse interstitial prominence is unchanged and mild interstitial edema cannot be excluded. No definite evidence of pulmonary consolidation or pleural effusion. IMPRESSION: No significant change in cardiomegaly and diffuse interstitial prominence, suspicious for interstitial edema. Electronically Signed   By: Earle Gell M.D.   On: 08/17/2015 09:09       --Marda Stalker, MD.  ICU Pager: [phone] [facility] Office Number: [phone]  Patricia Pesa, M.D.  Vilinda Boehringer, M.D.  Merton Border, M.D  08/18/2015   Critical Care Attestation.  I have personally obtained a history, examined the patient, evaluated laboratory and imaging results, formulated the assessment and plan and placed orders. The Patient requires high complexity decision making for assessment and support, frequent evaluation and titration of therapies, application of advanced monitoring technologies and extensive interpretation of multiple databases. The patient has critical illness that could lead imminently to failure of 1 or more organ systems and requires the highest level of physician preparedness to intervene.  Critical Care Time devoted to patient care services described in this note is 35 minutes and is exclusive of time spent in procedures.

## 2015-08-18 NOTE — Progress Notes (Addendum)
Chaplain rounded the unit and provided a compassionate presence and spiritual support to the patient with silent prayer.Sherry Ramsey [phone]

## 2015-08-19 ENCOUNTER — Inpatient Hospital Stay: Payer: Medicaid Other

## 2015-08-19 LAB — BLOOD GAS, ARTERIAL
ACID-BASE EXCESS: 26.1 mmol/L — AB (ref 0.0–3.0)
Allens test (pass/fail): POSITIVE — AB
BICARBONATE: 55.4 meq/L — AB (ref 21.0–28.0)
FIO2: 0.4
MECHANICAL RATE: 22
MECHVT: 400 mL
O2 Saturation: 95.8 %
PEEP/CPAP: 5 cmH2O
Patient temperature: 37
RATE: 22 resp/min
pCO2 arterial: 98 mmHg (ref 32.0–48.0)
pH, Arterial: 7.36 (ref 7.350–7.450)
pO2, Arterial: 74 mmHg — ABNORMAL LOW (ref 83.0–108.0)

## 2015-08-19 LAB — CBC
HCT: 26.4 % — ABNORMAL LOW (ref 35.0–47.0)
Hemoglobin: 7.9 g/dL — ABNORMAL LOW (ref 12.0–16.0)
MCH: 25 pg — ABNORMAL LOW (ref 26.0–34.0)
MCHC: 29.8 g/dL — ABNORMAL LOW (ref 32.0–36.0)
MCV: 83.8 fL (ref 80.0–100.0)
PLATELETS: 395 10*3/uL (ref 150–440)
RBC: 3.15 MIL/uL — ABNORMAL LOW (ref 3.80–5.20)
RDW: 18.5 % — AB (ref 11.5–14.5)
WBC: 19.9 10*3/uL — AB (ref 3.6–11.0)

## 2015-08-19 LAB — CULTURE, RESPIRATORY W GRAM STAIN: Special Requests: NORMAL

## 2015-08-19 LAB — CULTURE, RESPIRATORY

## 2015-08-19 LAB — BASIC METABOLIC PANEL
ANION GAP: 4 — AB (ref 5–15)
BUN: 28 mg/dL — ABNORMAL HIGH (ref 6–20)
CALCIUM: 8.8 mg/dL — AB (ref 8.9–10.3)
CO2: 45 mmol/L — ABNORMAL HIGH (ref 22–32)
Chloride: 93 mmol/L — ABNORMAL LOW (ref 101–111)
Creatinine, Ser: 0.84 mg/dL (ref 0.44–1.00)
GLUCOSE: 83 mg/dL (ref 65–99)
Potassium: 3.9 mmol/L (ref 3.5–5.1)
Sodium: 142 mmol/L (ref 135–145)

## 2015-08-19 LAB — GLUCOSE, CAPILLARY
GLUCOSE-CAPILLARY: 84 mg/dL (ref 65–99)
GLUCOSE-CAPILLARY: 80 mg/dL (ref 65–99)
GLUCOSE-CAPILLARY: 83 mg/dL (ref 65–99)
GLUCOSE-CAPILLARY: 102 mg/dL — AB (ref 65–99)
Glucose-Capillary: 102 mg/dL — ABNORMAL HIGH (ref 65–99)
Glucose-Capillary: 86 mg/dL (ref 65–99)

## 2015-08-19 LAB — VANCOMYCIN, TROUGH: VANCOMYCIN TR: 14 ug/mL (ref 10–20)

## 2015-08-19 MED ORDER — FUROSEMIDE 10 MG/ML IJ SOLN
40.0000 mg | Freq: Two times a day (BID) | INTRAMUSCULAR | Status: DC
  Administered 2015-08-19 – 2015-08-20 (×3): 40 mg via INTRAVENOUS
  Filled 2015-08-19 (×3): qty 4

## 2015-08-19 MED ORDER — NONFORMULARY OR COMPOUNDED ITEM
20.0000 mg | Freq: Two times a day (BID) | Status: DC
  Administered 2015-08-20 (×2): 20 mg via VAGINAL
  Filled 2015-08-19 (×4): qty 1

## 2015-08-19 MED ORDER — LORAZEPAM 2 MG/ML IJ SOLN
1.0000 mg | INTRAMUSCULAR | Status: DC | PRN
  Administered 2015-08-19 – 2015-08-20 (×5): 2 mg via INTRAVENOUS
  Filled 2015-08-19 (×5): qty 1

## 2015-08-19 MED ORDER — DEXMEDETOMIDINE HCL IN NACL 400 MCG/100ML IV SOLN
0.4000 ug/kg/h | INTRAVENOUS | Status: DC
  Administered 2015-08-19 (×4): 1.2 ug/kg/h via INTRAVENOUS
  Administered 2015-08-19: 0.2 ug/kg/h via INTRAVENOUS
  Administered 2015-08-19 – 2015-08-20 (×14): 1.2 ug/kg/h via INTRAVENOUS
  Filled 2015-08-19: qty 100
  Filled 2015-08-19: qty 50
  Filled 2015-08-19 (×5): qty 100
  Filled 2015-08-19: qty 200
  Filled 2015-08-19: qty 100
  Filled 2015-08-19: qty 200
  Filled 2015-08-19 (×2): qty 100
  Filled 2015-08-19: qty 200
  Filled 2015-08-19 (×6): qty 100

## 2015-08-19 NOTE — Progress Notes (Signed)
Pharmacy Antibiotic Note  Sherry Ramsey is a 35 y.o. female admitted on 08/13/2015 with pneumonia. Pharmacy has been consulted to dose vancomycin and piperacillin/tazobactam.   This is day #7 of antibiotics and patient is currently prescribed piperacillin/tazobactam and vancomycin. Respiratory culture is growing MRSA, providencia, and pseudomonas.  Plan: Continue zosyn 4.5gm IV Q8H extended infusion  Vancomycin trough drawn this morning was 14 mcg/mL on a regimen of vancomycin 1500 mg IV q12h. This was obtained prior to the 5th dose and should represent steady state. Will continue with current regimen even though slightly below goal of 15-20 mcg/mL as patient will likely accumulate due to morbid obesity. Will follow renal function closely.  Will check another vancomycin trough on 4/27 @ 0930 since high risk for accumulation.  Height: 5\' 4"  (162.6 cm) Weight:  (bed scale broken) IBW/kg (Calculated) : 54.7  Temp (24hrs), Avg:98.4 F (36.9 C), Min:97.8 F (36.6 C), Max:99.6 F (37.6 C)   Recent Labs Lab 08/13/15 0838 08/13/15 1147  08/15/15 1504 08/16/15 0525 08/17/15 0050 08/17/15 0459 08/18/15 0431 08/19/15 0407 08/19/15 0933  WBC  --   --   < > 24.6* 17.4*  --  18.3* 18.9* 19.9*  --   CREATININE  --   --   < > 0.92 0.89  --  0.85 0.81 0.84  --   LATICACIDVEN 0.6 0.8  --   --   --   --   --   --   --   --   VANCOTROUGH  --   --   < >  --   --  9*  --   --   --  14  < > = values in this interval not displayed.  Estimated Creatinine Clearance: 191.8 mL/min (by C-G formula based on Cr of 0.84).    Allergies  Allergen Reactions  . Ibuprofen Other (See Comments)    Reaction: unknown    Antimicrobials this admission: Levaquin 4/19 >> 4/24 Vancomycin  4/19 >>  Zosyn 4/19 >>  Dose adjustments this admission: 4/20 Trough: 21mg c/ml Vancomycin 1500 Q8H -> 1250 Q8H 4/23 0100 vanc level 9. Changed to 1500 mg q 8 hours. Level before 4th new dose. Increasing cautiously due to  possibility of accumulation. 4/23: see above, will redose at 1500mg  IV Q12H 4/25: VT 14 - continue regimen of 1500 q12h   Microbiology results: Sputum: MRSA, providencia, pseudomonas MRSA PCR: positive 4/19 Blood cx: No growth 4 days  Thank you for allowing pharmacy to be a part of this patient's care.  Lenis Noon, PharmD Clinical Pharmacist 08/19/2015 11:44 AM

## 2015-08-19 NOTE — Progress Notes (Signed)
Patient is a long term care resident at [facility]. Per chart patient is on the vent. Clinical Social Worker (CSW) will continue to follow and assist as needed.   Blima Rich, LCSW [phone]

## 2015-08-19 NOTE — Progress Notes (Signed)
Patient coughed out NG tube during turn, tube feedings stopped.  This RN tried to pass another NG tube but met great resistance in both nostrils.  Was able to pass through once but patient started spitting out mucus that was blood tinged so tube was retracted.  Will continue to monitor.

## 2015-08-19 NOTE — Progress Notes (Signed)
[place] [facility] Progess Note    ASSESSMENT/PLAN   Patient seen and examined with the nurse practitioner, and I agree with the nurse practitioner's assessment and plan as detailed below, except where amended. The patient is a 35 year old female with morbid obesity, chronically trach dependent, now with acute MRSA bronchitis and hypercapnic respiratory failure-acute on chronic. She has currently lost her feeding tube access, we will consult general surgery to see if the patient is accessible for a PEG tube. Physical exam today shows no significant changes, the patient continues to have reduced mental status while on sedation. Lungs are clear to auscultation. Continue diuresis. Will wean down vent support as tolerated. Will consult OB/GYN vaginal bleeding due to known ovarian cancer. The patient is not a candidate for DVT prophylaxis chemically due to the presence of vaginal bleeding. Continue empiric antibiotics.  Marda Stalker M.D.  Critical Care Attestation.  I have personally obtained a history, examined the patient, evaluated laboratory and imaging results, formulated the assessment and plan and placed orders. The Patient requires high complexity decision making for assessment and support, frequent evaluation and titration of therapies, application of advanced monitoring technologies and extensive interpretation of multiple databases. The patient has critical illness that could lead imminently to failure of 1 or more organ systems and requires the highest level of physician preparedness to intervene.  Critical Care Time devoted to patient care services described in this note is 35 minutes and is exclusive of time spent in procedures.    PULMONARY A: Acute on Chronic Hypercapnic respiratory failure Chronic tracheostomy Obesity hypoventilation syndrome Possible viral syndrome/bronchitis versus HCAP P:  -Currently on tidal volume 400/22/5/40% -ABG reviewed today  appears to show severe hypercapnia, appears compensated on the ventilator. -Nebulized bronchodilator and steriods -Daily CXR -reviewed today, ,ontinued bibasilar atelectasis.small left pleural effusion, pulmonary edema -Increased lasix 40mg  BID -Continue empiric antibiotics    CARDIOVASCULAR A:  Cardiomegaly Congestive heart failure-last echo 4/10 with LVEF of 65% and grade 1 diastolic dysfunction HTN HLD  P:  -continue IV diuresis -Continue coreg after peg/ng tube placement -Hold lisinopril -Hydralazine prn for SBP>160, currently her blood pressure appears to be adequately controlled.  RENAL A:  No acute issues, creatinine normal. P:  -Foley to gravity -Monitor I/O -Monitor and replace electrolytes  GASTROINTESTINAL A:  No acute issues P:  Gastroentrology consulted for consideration of peg tube placement -PPI for stress ulcer phrophylaxis  HEMATOLOGIC A:  Vaginal bleed secondary to endometrial adenocarcinoma Anemia - blood loss, due to vaginal bleeding P:  -OB/gyn reconsulted,  -not received megace due to no enteral tube in place -Follow up as an outpatient with [facility] Gyn/Onc  INFECTIOUS A:  HCAP versus bronchitis in a high risk trached patient P:  -Empiric antibiotics  Antibiotics: Levaquin 4/19 >>08/17/15  Vancomycin 4/19 >>  Zosyn 4/19 >>  Cultures: 4/19. Blood culture: Pending 4/19. MRSA screen: Positive 4/19. Sputum culture: MRSA  ENDOCRINE A:  Type 2 DM P:  -Blood glucose monitoring with SSI coverage, glucose, appears controlled today.   Genitourinary A:  Endometerial Adenocarcinoma  NEUROLOGIC A:  Vent sedation P:  RASS goal: -1 to -2 -Propofol, fentanyl and versed for sedation and comfort  Disposition and Family update: Plan for [facility] referral initiated 04/21. No family at bedside    Best Practice: Code Status: Full Diet: NPO GI prophylaxis: PPI-will change to an H2 blocker. VTE prophylaxis:  SCD's /no pharmacologic VTE prophylaxis due to vaginal bleeding   ---------------------------------------   ----------------------------------------   Name: Sherry Bon  Ramsey MRN: [id number] DOB: Sep 07, 1980    ADMISSION DATE:  08/13/2015   SUBJECTIVE:   Pt currently on the ventilator, is sedated , was unabl   VITAL SIGNS: Temp:  [97.8 F (36.6 C)-99.6 F (37.6 C)] 99.6 F (37.6 C) (04/25 0800) Pulse Rate:  [88-185] 102 (04/25 0900) Resp:  [18-26] 22 (04/25 0900) BP: (91-122)/(55-90) 104/65 mmHg (04/25 0900) SpO2:  [92 %-100 %] 100 % (04/25 0900) FiO2 (%):  [40 %] 40 % (04/25 0800) HEMODYNAMICS:   VENTILATOR SETTINGS: Vent Mode:  [-] PRVC FiO2 (%):  [40 %] 40 % Set Rate:  [22 bmp] 22 bmp Vt Set:  [400 mL] 400 mL PEEP:  [5 cmH20] 5 cmH20 Plateau Pressure:  [22 cmH20] 22 cmH20 INTAKE / OUTPUT:  Intake/Output Summary (Last 24 hours) at 08/19/15 0952 Last data filed at 08/19/15 0800  Gross per 24 hour  Intake 2791.16 ml  Output   3500 ml  Net -708.84 ml    PHYSICAL EXAMINATION: Physical Examination:   VS: BP 104/65 mmHg  Pulse 102  Temp(Src) 99.6 F (37.6 C) (Oral)  Resp 22  Ht 5\' 4"  (1.626 m)  Wt 245.85 kg (542 lb)  BMI 92.99 kg/m2  SpO2 100%  LMP 07/27/2015  General Appearance: morbidly obese AA female sedated and vented and appears to be in no acute distress at this time. Neuro:,  Sedated on vent ,no discharge noted HEENT:Atraumatic, normocephalic,, no discharge noted  Pulmonary: crackles noted bilaterally, no wheezes, crackles, rhonchi noted Cardiovascula rNormal S1,S2.  No m/r/g.   Abdomen: Obese,Benign, Soft, non-tender. Renal:  No costovertebral tenderness  GU:  Not performed at this time. Endocrine: No evident thyromegaly  Skin:   warm, no rashes, no ecchymosis  Extremities: normal, no cyanosis, clubbing.   LABS:   LABORATORY PANEL:   CBC  Recent Labs Lab 08/19/15 0407  WBC 19.9*  HGB 7.9*  HCT 26.4*  PLT 395    Chemistries    Recent Labs Lab 08/18/15 0431 08/19/15 0407  NA 142 142  K 4.4 3.9  CL 94* 93*  CO2 43* 45*  GLUCOSE 115* 83  BUN 25* 28*  CREATININE 0.81 0.84  CALCIUM 8.7* 8.8*  MG 2.5*  --   PHOS 3.8  --      Recent Labs Lab 08/18/15 1152 08/18/15 1710 08/18/15 1948 08/19/15 0047 08/19/15 0411 08/19/15 0745  GLUCAP 91 85 85 86 84 80    Recent Labs Lab 08/16/15 0416 08/18/15 0354 08/19/15 0500  PHART 7.34* 7.35 7.36  PCO2ART 91* 97* 98*  PO2ART 74* 74* 74*   No results for input(s): AST, ALT, ALKPHOS, BILITOT, ALBUMIN in the last 168 hours.  Cardiac Enzymes  Recent Labs Lab 08/13/15 1524  TROPONINI 0.04*    RADIOLOGY:  Dg Chest 1 View  08/19/2015  CLINICAL DATA:  Tracheostomy. EXAM: CHEST 1 VIEW COMPARISON:  08/18/2015. FINDINGS: Tracheostomy tube in stable position. Cardiomegaly with pulmonary vascular prominence and interstitial prominence again noted without interim change. Findings consistent congestive heart failure. Small left pleural effusion. No pneumothorax. IMPRESSION: 1. Tracheostomy tube in stable position. 2. Stable cardiomegaly with pulmonary vascular prominence interstitial prominence and small left pleural effusion. Findings consistent congestive heart failure. No interim change. Electronically Signed   By: Marcello Moores  Register   On: 08/19/2015 07:26   Dg Chest Port 1 View  08/18/2015  CLINICAL DATA:  Respiratory failure. EXAM: PORTABLE CHEST 1 VIEW COMPARISON:  08/17/2015. FINDINGS: Tracheostomy tube noted in stable position. Cardiomegaly with pulmonary vascular prominence and bilateral  interstitial prominence consistent with congestive heart failure. Similar findings noted on prior exam small left pleural effusion cannot be excluded. No pneumothorax. IMPRESSION: 1. Tracheostomy tube in stable position. 2. Cardiomegaly with pulmonary vascular prominence and interstitial prominence again noted without interim change. Findings consistent congestive heart failure.  Small left pleural effusion cannot be excluded. Electronically Signed   By: Marcello Moores  Register   On: 08/18/2015 07:28   Dg Abd Portable 1v  08/18/2015  CLINICAL DATA:  Evaluate NG tube placement. Limited evaluation due to body habitus. EXAM: PORTABLE ABDOMEN - 1 VIEW COMPARISON:  Chest radiograph 08/18/2015 FINDINGS: Enteric tube projects over the left upper quadrant. Limited exam due to body habitus. Additional monitoring leads project over the left hemi abdomen. IMPRESSION: NG tube tip projects over the left upper quadrant. Electronically Signed   By: Lovey Newcomer M.D.   On: 08/18/2015 17:16         Bincy Varughese,AG-ACNP Pulmonary & Critical Care

## 2015-08-19 NOTE — Progress Notes (Signed)
Nutrition Follow-up  DOCUMENTATION CODES:   Morbid obesity  INTERVENTION:   -Per discussion during ICU rounds,  Plan to attempt insertion of NG tube again today with reinitiation of TF. Order set remains active for Adult Tube Feeding Protocol. Recommend restarting TF once NG tube inserted  NUTRITION DIAGNOSIS:   Inadequate oral intake related to acute illness as evidenced by NPO status.  Continues  GOAL:   Provide needs based on ASPEN/SCCM guidelines  MONITOR:   TF tolerance, Vent status, Labs, Weight trends, I & O's, Skin  REASON FOR ASSESSMENT:   Low Braden, Ventilator    ASSESSMENT:   35 yo female admitted with respiratory distress with hx of chronic trach due to OSA/COPD/CHF and symptomatic anemia with chronic vaginal bleeding diagnosed with endometrial cancer on 07/31/15.   Pt remains on vent, sedated on fentanyl and diprivan  NG successfully placed yesterday and TF initiated but pt "coughed out" the NG tube  Diet Order:  Diet NPO time specified   EN: TF on hold as no NG at present  Skin:  Reviewed, no issues  Last BM:  4/21  Meds: ss novolog, lasix, preced Height:   Ht Readings from Last 1 Encounters:  08/13/15 5\' 4"  (1.626 m)    Weight:   Wt Readings from Last 1 Encounters:  08/13/15 542 lb (245.85 kg)    Ideal Body Weight:  60 kg  BMI:  Body mass index is 92.99 kg/(m^2).  Estimated Nutritional Needs:   Kcal:  1320-1500 kcals (22-25 kcals/kg IBW per APEN guidelines for BMI >50)  Protein:  >/= 150 g (2.5 g/kg)   Fluid:  >/= 1.5 L per day  EDUCATION NEEDS:   No education needs identified at this time  [place], [place], [place] [phone] Pager  [phone] Weekend/On-Call Pager

## 2015-08-19 NOTE — Consult Note (Signed)
GI Inpatient Consult Note  Reason for Consult: Peg tube placement   Attending Requesting Consult: Dr. Earleen Newport  History of Present Illness: Sherry Ramsey is a 35 y.o. female with a significant pmh of morbid obesity, COPD and CHF is being treated for respiratory failure.  All information was taken from medical records due to patients mental status.  She is chronically trach dependent, currently being treated for MRSA bronchitis and hypercapnic respiratory failure- acute on chronic.  She had an NG tube placed for feeding, but unfortunately, she coughed it out and another one was placed but unfortunately she began to spit out blood tinged mucus so it was removed.  Past Medical History:  Past Medical History  Diagnosis Date  . Diabetes ([place])   . HTN (hypertension)   . HLD (hyperlipidemia)   . Chronic pain   . CHF (congestive heart failure) ([place])   . MDD (major depressive disorder) ([place])   . Anxiety   . Blood transfusion without reported diagnosis   . Cancer [facility])     Uterine    Problem List: Patient Active Problem List   Diagnosis Date Noted  . Acute respiratory failure with hypoxia ([place]) 08/13/2015  . Type 2 diabetes mellitus ([place]) 08/03/2015  . HTN (hypertension) 08/03/2015  . HLD (hyperlipidemia) 08/03/2015  . Chronic systolic CHF (congestive heart failure) ([place]) 08/03/2015  . Elevated troponin 08/03/2015  . Symptomatic anemia 08/03/2015    Past Surgical History: Past Surgical History  Procedure Laterality Date  . Tracheostomy      Allergies: Allergies  Allergen Reactions  . Ibuprofen Other (See Comments)    Reaction: unknown    Home Medications: Prescriptions prior to admission  Medication Sig Dispense Refill Last Dose  . atorvastatin (LIPITOR) 10 MG tablet Take 10 mg by mouth at bedtime.   08/12/2015 at Unknown time  . butalbital-acetaminophen-caffeine (FIORICET, ESGIC) 50-325-40 MG tablet Take 1 tablet by mouth every 4 (four) hours as needed for headache.   PRN at  PRN  . carvedilol (COREG) 6.25 MG tablet Take 6.25 mg by mouth 2 (two) times daily with a meal.   08/12/2015 at Unknown time  . clonazePAM (KLONOPIN) 0.5 MG tablet Take 1 mg by mouth at bedtime.   08/12/2015 at Unknown time  . diphenhydrAMINE (BENADRYL) 25 MG tablet Take 25 mg by mouth every 6 (six) hours as needed for itching.   08/12/2015 at PRN  . furosemide (LASIX) 40 MG tablet Take 40 mg by mouth.   08/12/2015 at Unknown time  . guaiFENesin (MUCINEX) 600 MG 12 hr tablet Take 600 mg by mouth 2 (two) times daily as needed for cough.   PRN at PRN  . [EXPIRED] HYDROmorphone (DILAUDID) 2 MG tablet Take 2 mg by mouth every 4 (four) hours as needed for severe pain.   PRN at PRN  . lisinopril (PRINIVIL,ZESTRIL) 5 MG tablet Take 5 mg by mouth daily.   08/12/2015 at Unknown time  . medroxyPROGESTERone (PROVERA) 10 MG tablet Take 10 mg by mouth daily.   08/12/2015 at Unknown time  . Melatonin 5 MG TABS Take 10 mg by mouth at bedtime.   08/12/2015 at Unknown time  . Multiple Vitamin (MULTIVITAMIN WITH MINERALS) TABS tablet Take 1 tablet by mouth daily.   08/12/2015 at Unknown time  . senna (SENOKOT) 8.6 MG tablet Take 2 tablets by mouth 2 (two) times daily.   08/12/2015 at Unknown time  . topiramate (TOPAMAX) 25 MG tablet Take 25 mg by mouth 2 (two) times daily.  08/12/2015 at Unknown time  . traZODone (DESYREL) 50 MG tablet Take 200 mg by mouth at bedtime.   08/12/2015 at Unknown time  . sertraline (ZOLOFT) 50 MG tablet Take 50 mg by mouth daily.   08/03/2015 at Unknown time   Home medication reconciliation was completed with the patient.   Scheduled Inpatient Medications:   . budesonide (PULMICORT) nebulizer solution  0.25 mg Nebulization BID  . Chlorhexidine Gluconate Cloth  6 each Topical Q0600  . feeding supplement (VITAL HIGH PROTEIN)  1,000 mL Per Tube Q24H  . furosemide  40 mg Intravenous BID  . insulin aspart  0-15 Units Subcutaneous Q4H  . ipratropium-albuterol  3 mL Nebulization Q6H  .  piperacillin-tazobactam (ZOSYN)  IV  3.375 g Intravenous Q8H  . sodium chloride flush  3 mL Intravenous Q12H  . vancomycin  1,500 mg Intravenous Q12H    Continuous Inpatient Infusions:   . dexmedetomidine 0.2 mcg/kg/hr (08/19/15 1434)  . fentaNYL infusion INTRAVENOUS 200 mcg/hr (08/19/15 0722)    PRN Inpatient Medications:  acetaminophen **OR** acetaminophen, albuterol, fentaNYL, hydrALAZINE, LORazepam, midazolam  Family History: family history includes Hypertension in her mother. There is no history of Diabetes or Heart failure.    Social History:   reports that she has quit smoking. She does not have any smokeless tobacco history on file. She reports that she does not drink alcohol or use illicit drugs.    Review of Systems: Unable to assess due to mental status   Physical Examination: BP 121/79 mmHg  Pulse 95  Temp(Src) 99.6 F (37.6 C) (Oral)  Resp 25  Ht 5\' 4"  (1.626 m)  Wt 245.85 kg (542 lb)  BMI 92.99 kg/m2  SpO2 95%  LMP 07/27/2015 Gen: She does not respond to questions, labored breathing, no family is present, very hard to assess all PE due to body habitus  HEENT: PEERLA, EOMI, Neck: supple, no JVD or thyromegaly Chest: CTA bilaterally, no wheezes, crackles, or other adventitious sounds CV: RRR, no m/g/c/r Abd: soft, NT, ND, +BS in all four quadrants; no HSM, guarding, ridigity, or rebound tenderness Ext: no edema, well perfused with 2+ pulses, Skin: no rash or lesions noted Lymph: no LAD  Data: Lab Results  Component Value Date   WBC 19.9* 08/19/2015   HGB 7.9* 08/19/2015   HCT 26.4* 08/19/2015   MCV 83.8 08/19/2015   PLT 395 08/19/2015    Recent Labs Lab 08/17/15 0459 08/18/15 0431 08/19/15 0407  HGB 7.5* 7.3* 7.9*   Lab Results  Component Value Date   NA 142 08/19/2015   K 3.9 08/19/2015   CL 93* 08/19/2015   CO2 45* 08/19/2015   BUN 28* 08/19/2015   CREATININE 0.84 08/19/2015   No results found for: ALT, AST, GGT, ALKPHOS, BILITOT No  results for input(s): APTT, INR, PTT in the last 168 hours.   Assessment/Plan: Sherry Ramsey is a 35 y.o. female with acute respiratory failure, on trach, NG tube placed for feeding, came out - replacement unsuccessful.   Recommendations: We are unable to perform peg tube placement at this facility.  Concerns for ability for luminal illumination due to body habitus.  Recommend IR or surgical consult.  Thank you for the consult. Please call with questions or concerns.  Salvadore Farber, PA-C  I personally performed these services.

## 2015-08-19 NOTE — Progress Notes (Signed)
Patient has tolerated the ventilator and sedation throughout the night, localizes pain but does not follow commands.  Vital signs remain stable and urine output has been adequate.  Able to mobilize patient for a bath with the assistance of 4 other staff members, patient tolerated well.  Will continue to monitor.

## 2015-08-19 NOTE — Consult Note (Signed)
Obstetric and Gynecology  Subjective  Altered mental status, patient non-verbal  Objective   Filed Vitals:   08/19/15 1900 08/19/15 2000  BP: 130/84 128/76  Pulse: 87 83  Temp:    Resp: 21 34     Intake/Output Summary (Last 24 hours) at 08/19/15 2121 Last data filed at 08/19/15 2100  Gross per 24 hour  Intake 2727.14 ml  Output   3800 ml  Net -1072.86 ml    General: trach with ventilator support GU: Minimal blood on pad  Labs: Results for orders placed or performed during the hospital encounter of 08/13/15 (from the past 24 hour(s))  Glucose, capillary     Status: None   Collection Time: 08/19/15 12:47 AM  Result Value Ref Range   Glucose-Capillary 86 65 - 99 mg/dL  CBC     Status: Abnormal   Collection Time: 08/19/15  4:07 AM  Result Value Ref Range   WBC 19.9 (H) 3.6 - 11.0 K/uL   RBC 3.15 (L) 3.80 - 5.20 MIL/uL   Hemoglobin 7.9 (L) 12.0 - 16.0 g/dL   HCT 26.4 (L) 35.0 - 47.0 %   MCV 83.8 80.0 - 100.0 fL   MCH 25.0 (L) 26.0 - 34.0 pg   MCHC 29.8 (L) 32.0 - 36.0 g/dL   RDW 18.5 (H) 11.5 - 14.5 %   Platelets 395 150 - 440 K/uL  Basic metabolic panel     Status: Abnormal   Collection Time: 08/19/15  4:07 AM  Result Value Ref Range   Sodium 142 135 - 145 mmol/L   Potassium 3.9 3.5 - 5.1 mmol/L   Chloride 93 (L) 101 - 111 mmol/L   CO2 45 (H) 22 - 32 mmol/L   Glucose, Bld 83 65 - 99 mg/dL   BUN 28 (H) 6 - 20 mg/dL   Creatinine, Ser 0.84 0.44 - 1.00 mg/dL   Calcium 8.8 (L) 8.9 - 10.3 mg/dL   GFR calc non Af Amer >60 >60 mL/min   GFR calc Af Amer >60 >60 mL/min   Anion gap 4 (L) 5 - 15  Glucose, capillary     Status: None   Collection Time: 08/19/15  4:11 AM  Result Value Ref Range   Glucose-Capillary 84 65 - 99 mg/dL  Blood gas, arterial     Status: Abnormal   Collection Time: 08/19/15  5:00 AM  Result Value Ref Range   FIO2 0.40    Delivery systems VENTILATOR    Mode PRESSURE REGULATED VOLUME CONTROL    VT 400 mL   LHR 22 resp/min   Peep/cpap 5.0 cm  H20   pH, Arterial 7.36 7.350 - 7.450   pCO2 arterial 98 (HH) 32.0 - 48.0 mmHg   pO2, Arterial 74 (L) 83.0 - 108.0 mmHg   Bicarbonate 55.4 (H) 21.0 - 28.0 mEq/L   Acid-Base Excess 26.1 (H) 0.0 - 3.0 mmol/L   O2 Saturation 95.8 %   Patient temperature 37.0    Collection site LEFT RADIAL    Sample type ARTERIAL DRAW    Allens test (pass/fail) POSITIVE (A) PASS   Mechanical Rate 22   Glucose, capillary     Status: None   Collection Time: 08/19/15  7:45 AM  Result Value Ref Range   Glucose-Capillary 80 65 - 99 mg/dL  Vancomycin, trough     Status: None   Collection Time: 08/19/15  9:33 AM  Result Value Ref Range   Vancomycin Tr 14 10 - 20 ug/mL  Glucose, capillary  Status: None   Collection Time: 08/19/15 11:46 AM  Result Value Ref Range   Glucose-Capillary 83 65 - 99 mg/dL  Glucose, capillary     Status: Abnormal   Collection Time: 08/19/15  5:44 PM  Result Value Ref Range   Glucose-Capillary 102 (H) 65 - 99 mg/dL   Comment 1 Notify RN   Glucose, capillary     Status: Abnormal   Collection Time: 08/19/15  8:05 PM  Result Value Ref Range   Glucose-Capillary 102 (H) 65 - 99 mg/dL    Cultures: Results for orders placed or performed during the hospital encounter of 08/13/15  Culture, expectorated sputum-assessment     Status: None   Collection Time: 08/13/15  8:38 AM  Result Value Ref Range Status   Specimen Description EXPECTORATED SPUTUM  Final   Special Requests Normal  Final   Sputum evaluation THIS SPECIMEN IS ACCEPTABLE FOR SPUTUM CULTURE  Final   Report Status 08/13/2015 FINAL  Final  Culture, respiratory (NON-Expectorated)     Status: None   Collection Time: 08/13/15  8:38 AM  Result Value Ref Range Status   Specimen Description EXPECTORATED SPUTUM  Final   Special Requests Normal Reflexed from [id number]  Final   Gram Stain   Final    MODERATE WBC SEEN MODERATE GRAM POSITIVE RODS FEW GRAM POSITIVE COCCI MODERATE GRAM NEGATIVE DIPLOCOCCI GOOD SPECIMEN - 80-90%  WBCS    Culture   Final    MODERATE GROWTH METHICILLIN RESISTANT STAPHYLOCOCCUS AUREUS LIGHT GROWTH PSEUDOMONAS PUTIDA LIGHT GROWTH PROVIDENCIA STUARTII    Report Status 08/19/2015 FINAL  Final   Organism ID, Bacteria METHICILLIN RESISTANT STAPHYLOCOCCUS AUREUS  Final   Organism ID, Bacteria PROVIDENCIA STUARTII  Final   Organism ID, Bacteria PSEUDOMONAS PUTIDA  Final      Susceptibility   Pseudomonas putida - MIC*    CEFTAZIDIME 4 SENSITIVE Sensitive     CIPROFLOXACIN >=4 RESISTANT Resistant     GENTAMICIN <=1 SENSITIVE Sensitive     IMIPENEM 1 SENSITIVE Sensitive     PIP/TAZO 16 SENSITIVE Sensitive     * LIGHT GROWTH PSEUDOMONAS PUTIDA   Methicillin resistant staphylococcus aureus - MIC*    CIPROFLOXACIN >=8 RESISTANT Resistant     ERYTHROMYCIN >=8 RESISTANT Resistant     GENTAMICIN <=0.5 SENSITIVE Sensitive     OXACILLIN >=4 RESISTANT Resistant     TETRACYCLINE 2 SENSITIVE Sensitive     VANCOMYCIN 1 SENSITIVE Sensitive     TRIMETH/SULFA <=10 SENSITIVE Sensitive     CLINDAMYCIN >=8 RESISTANT Resistant     RIFAMPIN <=0.5 SENSITIVE Sensitive     Inducible Clindamycin NEGATIVE Sensitive     * MODERATE GROWTH METHICILLIN RESISTANT STAPHYLOCOCCUS AUREUS   Providencia stuartii - MIC*    AMPICILLIN >=32 RESISTANT Resistant     CEFAZOLIN >=64 RESISTANT Resistant     CEFEPIME 4 SENSITIVE Sensitive     CEFTAZIDIME 32 RESISTANT Resistant     CEFTRIAXONE <=1 SENSITIVE Sensitive     CIPROFLOXACIN >=4 RESISTANT Resistant     GENTAMICIN >=16 RESISTANT Resistant     IMIPENEM 4 SENSITIVE Sensitive     TRIMETH/SULFA <=20 SENSITIVE Sensitive     AMPICILLIN/SULBACTAM >=32 RESISTANT Resistant     PIP/TAZO 8 SENSITIVE Sensitive     * LIGHT GROWTH PROVIDENCIA STUARTII  MRSA PCR Screening     Status: Abnormal   Collection Time: 08/13/15 11:12 AM  Result Value Ref Range Status   MRSA by PCR POSITIVE (A) NEGATIVE Final    Comment: CRITICAL RESULT  CALLED TO, READ BACK BY AND VERIFIED  WITH: SANDRA BORBA AT [id number] ON 08/13/15.Marland KitchenMarland KitchenFords Prairie        The GeneXpert MRSA Assay (FDA approved for NASAL specimens only), is one component of a comprehensive MRSA colonization surveillance program. It is not intended to diagnose MRSA infection nor to guide or monitor treatment for MRSA infections.   Culture, blood (Routine X 2) w Reflex to ID Panel     Status: None (Preliminary result)   Collection Time: 08/13/15  2:29 PM  Result Value Ref Range Status   Specimen Description BLOOD LEFT AC  Final   Special Requests   Final    BOTTLES DRAWN AEROBIC AND ANAEROBIC AER 8CC [id number]   Culture NO GROWTH 4 DAYS  Final   Report Status PENDING  Incomplete  Culture, blood (Routine X 2) w Reflex to ID Panel     Status: None (Preliminary result)   Collection Time: 08/13/15  3:24 PM  Result Value Ref Range Status   Specimen Description BLOOD LEFT ASSIST CONTROL  Final   Special Requests   Final    BOTTLES DRAWN AEROBIC AND ANAEROBIC [place]   Culture NO GROWTH 4 DAYS  Final   Report Status PENDING  Incomplete    Imaging:  Assessment   35 y.o.  With endometrial cancer currently admitted for respiratory failure/MRSA PNA  Plan   I was asked to weight in on treatment regimen for patient given that she is not unable to take po, and attempts at NG tube placement have been unsuccessful.  Given her immobility, underlying malignancy she is a poor candidate for IV estrogen given thromboembolic risk.  Megace may be dosed vaginally or rectally but given patient habitus this may be difficult.  In the acute setting she does not need to be on progestin as it is a second line treatment for non-surgical candidates and may be held until she is weaned of the vent.  However, progestin administration may be beneficial in controlling and vaginal bleeding.  Her H&H appears to have remained relatively stable.  Ultimately she is likely best served at a tertiary care facility.  She may be a candidate for palliative  XRT or chemotherapy.  [facility] will be in house tomorrow and it may be helpful to obtain a consult from them as far as any other treatment option.  I anticipate depo provera will likely be to low dose and I'm uncertain of any benefit from depo leupron.

## 2015-08-19 NOTE — Care Management (Addendum)
Patient's PCO2 remains elevated and discussed during progression that patient most likely has chronically elevate PCO2.  patient with continued agitation and on Precedex drip.   Patient had dobb hoff tube inserted within last 24 hours and "it came out."  Discussed nutritional options- TPN/Peg.  General  surgical consult will be ordered.  It is very unlikely that peg can be inserted at this facility due to habitus.  Patient also has new diagnosis of ovarian cancer which was diagnosed at [facility].  There is discussion of transfer to tertiary facility (?[facility]) for follow up of ovarian cancer and peg insertion if it is found that patient care needs can not be met at [facility].  GYN and Palliative care, oncology, surgery consults will be initiated.  There is also concern that patient will require ventilator at night chronically and this need can not be met at current snf facility.  Updated csw. 

## 2015-08-20 ENCOUNTER — Inpatient Hospital Stay: Payer: Medicaid Other

## 2015-08-20 ENCOUNTER — Ambulatory Visit (HOSPITAL_COMMUNITY)
Admission: AD | Admit: 2015-08-20 | Discharge: 2015-08-20 | Disposition: A | Discharge status: Home / Self Care | Payer: Medicaid Other | Source: Other Acute Inpatient Hospital | Attending: Internal Medicine | Admitting: Internal Medicine

## 2015-08-20 DIAGNOSIS — J9600 Acute respiratory failure, unspecified whether with hypoxia or hypercapnia: Secondary | ICD-10-CM

## 2015-08-20 DIAGNOSIS — J9690 Respiratory failure, unspecified, unspecified whether with hypoxia or hypercapnia: Secondary | ICD-10-CM | POA: Insufficient documentation

## 2015-08-20 LAB — BLOOD GAS, ARTERIAL
ACID-BASE EXCESS: 23.1 mmol/L — AB (ref 0.0–3.0)
Allens test (pass/fail): POSITIVE — AB
Bicarbonate: 50.4 mEq/L — ABNORMAL HIGH (ref 21.0–28.0)
FIO2: 0.4
MECHVT: 400 mL
Mechanical Rate: 22
O2 Saturation: 92 %
PATIENT TEMPERATURE: 37
PCO2 ART: 76 mmHg — AB (ref 32.0–48.0)
PEEP/CPAP: 5 cmH2O
PH ART: 7.43 (ref 7.350–7.450)
PO2 ART: 62 mmHg — AB (ref 83.0–108.0)
RATE: 22 resp/min

## 2015-08-20 LAB — BASIC METABOLIC PANEL
Anion gap: 8 (ref 5–15)
BUN: 26 mg/dL — AB (ref 6–20)
CHLORIDE: 93 mmol/L — AB (ref 101–111)
CO2: 39 mmol/L — ABNORMAL HIGH (ref 22–32)
Calcium: 8.5 mg/dL — ABNORMAL LOW (ref 8.9–10.3)
Creatinine, Ser: 0.87 mg/dL (ref 0.44–1.00)
Glucose, Bld: 117 mg/dL — ABNORMAL HIGH (ref 65–99)
POTASSIUM: 3.8 mmol/L (ref 3.5–5.1)
SODIUM: 140 mmol/L (ref 135–145)

## 2015-08-20 LAB — CBC
HCT: 25.8 % — ABNORMAL LOW (ref 35.0–47.0)
HEMOGLOBIN: 8 g/dL — AB (ref 12.0–16.0)
MCH: 24.9 pg — AB (ref 26.0–34.0)
MCHC: 31 g/dL — ABNORMAL LOW (ref 32.0–36.0)
MCV: 80.5 fL (ref 80.0–100.0)
PLATELETS: 297 10*3/uL (ref 150–440)
RBC: 3.21 MIL/uL — AB (ref 3.80–5.20)
RDW: 18 % — ABNORMAL HIGH (ref 11.5–14.5)
WBC: 17.2 10*3/uL — AB (ref 3.6–11.0)

## 2015-08-20 LAB — GLUCOSE, CAPILLARY
GLUCOSE-CAPILLARY: 107 mg/dL — AB (ref 65–99)
GLUCOSE-CAPILLARY: 109 mg/dL — AB (ref 65–99)
GLUCOSE-CAPILLARY: 119 mg/dL — AB (ref 65–99)
GLUCOSE-CAPILLARY: 121 mg/dL — AB (ref 65–99)
GLUCOSE-CAPILLARY: 123 mg/dL — AB (ref 65–99)
Glucose-Capillary: 127 mg/dL — ABNORMAL HIGH (ref 65–99)

## 2015-08-20 LAB — PHOSPHORUS: Phosphorus: 3.6 mg/dL (ref 2.5–4.6)

## 2015-08-20 LAB — MAGNESIUM: MAGNESIUM: 2 mg/dL (ref 1.7–2.4)

## 2015-08-20 MED ORDER — FAMOTIDINE IN NACL 20-0.9 MG/50ML-% IV SOLN
20.0000 mg | Freq: Two times a day (BID) | INTRAVENOUS | Status: DC
  Administered 2015-08-20: 20 mg via INTRAVENOUS
  Filled 2015-08-20 (×3): qty 50

## 2015-08-20 MED ORDER — ACETAMINOPHEN 10 MG/ML IV SOLN
650.0000 mg | Freq: Four times a day (QID) | INTRAVENOUS | Status: DC | PRN
  Filled 2015-08-20 (×2): qty 65

## 2015-08-20 MED ORDER — FAMOTIDINE IN NACL 20-0.9 MG/50ML-% IV SOLN
20.0000 mg | Freq: Two times a day (BID) | INTRAVENOUS | Status: DC
  Filled 2015-08-20 (×2): qty 50

## 2015-08-20 MED ORDER — TRACE MINERALS CR-CU-MN-SE-ZN 10-1000-500-60 MCG/ML IV SOLN
INTRAVENOUS | Status: DC
  Administered 2015-08-20: 18:00:00 via INTRAVENOUS
  Filled 2015-08-20: qty 1200

## 2015-08-20 MED ORDER — PIPERACILLIN-TAZOBACTAM 4.5 G IVPB
4.5000 g | Freq: Three times a day (TID) | INTRAVENOUS | Status: DC
  Filled 2015-08-20 (×3): qty 100

## 2015-08-20 MED ORDER — NONFORMULARY OR COMPOUNDED ITEM
40.0000 mg | Freq: Two times a day (BID) | Status: DC
  Filled 2015-08-20: qty 1

## 2015-08-20 MED ORDER — ACETAMINOPHEN 10 MG/ML IV SOLN
1000.0000 mg | Freq: Four times a day (QID) | INTRAVENOUS | Status: DC | PRN
  Administered 2015-08-20: 1000 mg via INTRAVENOUS
  Filled 2015-08-20 (×2): qty 100

## 2015-08-20 MED ORDER — NONFORMULARY OR COMPOUNDED ITEM: 40.0000 mg | Freq: Two times a day (BID) | Status: DC

## 2015-08-20 MED ORDER — PIPERACILLIN-TAZOBACTAM 4.5 G IVPB
4.5000 g | Freq: Three times a day (TID) | INTRAVENOUS | Status: DC
  Administered 2015-08-20: 4.5 g via INTRAVENOUS
  Filled 2015-08-20 (×3): qty 100

## 2015-08-20 MED FILL — Megestrol Acetate Tab 20 MG: ORAL | Qty: 1 | Status: AC

## 2015-08-20 NOTE — Progress Notes (Signed)
Pharmacy Consult for Electrolyte and Glucose Monitoring Indication: TPN  Allergies  Allergen Reactions  . Ibuprofen Other (See Comments)    Reaction: unknown    Patient Measurements: Height: 5\' 4"  (162.6 cm) Weight: (!) 579 lb 13 oz (263 kg) IBW/kg (Calculated) : 54.7  Vital Signs: Temp: 102.4 F (39.1 C) (04/26 1200) Temp Source: Oral (04/26 1200) BP: 135/83 mmHg (04/26 1400) Pulse Rate: 95 (04/26 1400) Intake/Output from previous day: 04/25 0701 - 04/26 0700 In: 2603.3 [I.V.:1453.3; IV Piggyback:1150] Out: 3825 [Urine:3825] Intake/Output from this shift: Total I/O In: 442.8 [I.V.:442.8] Out: 850 [Urine:850]  Labs:  Recent Labs  08/18/15 0431 08/19/15 0407 08/20/15 0639  WBC 18.9* 19.9* 17.2*  HGB 7.3* 7.9* 8.0*  HCT 24.1* 26.4* 25.8*  PLT 369 395 297     Recent Labs  08/18/15 0431 08/19/15 0407 08/20/15 0639  NA 142 142 140  K 4.4 3.9 3.8  CL 94* 93* 93*  CO2 43* 45* 39*  GLUCOSE 115* 83 117*  BUN 25* 28* 26*  CREATININE 0.81 0.84 0.87  CALCIUM 8.7* 8.8* 8.5*  MG 2.5*  --  2.0  PHOS 3.8  --  3.6  TRIG 243*  --   --    Estimated Creatinine Clearance: 194.8 mL/min (by C-G formula based on Cr of 0.87).    Recent Labs  08/20/15 0356 08/20/15 0744 08/20/15 1139  GLUCAP 119* 123* 107*    Medical History: Past Medical History  Diagnosis Date  . Diabetes ([place])   . HTN (hypertension)   . HLD (hyperlipidemia)   . Chronic pain   . CHF (congestive heart failure) ([place])   . MDD (major depressive disorder) ([place])   . Anxiety   . Blood transfusion without reported diagnosis   . Cancer ([place])     Uterine    Medications:  Scheduled:  . budesonide (PULMICORT) nebulizer solution  0.25 mg Nebulization BID  . famotidine (PEPCID) IV  20 mg Intravenous Q12H  . feeding supplement (VITAL HIGH PROTEIN)  1,000 mL Per Tube Q24H  . furosemide  40 mg Intravenous BID  . insulin aspart  0-15 Units Subcutaneous Q4H  . ipratropium-albuterol  3 mL Nebulization  Q6H  . Megace 20 mg  20 mg Vaginal BID  . piperacillin-tazobactam  4.5 g Intravenous Q8H  . sodium chloride flush  3 mL Intravenous Q12H  . vancomycin  1,500 mg Intravenous Q12H   Infusions:  . Marland KitchenTPN (CLINIMIX-E) Adult    . dexmedetomidine 1.2 mcg/kg/hr (08/20/15 1342)  . fentaNYL infusion INTRAVENOUS 150 mcg/hr (08/20/15 0957)    Insulin Requirements in the past 24 hours:    Current Nutrition:  Clinimix 5/15 E at 50 ml/hr  Assessment: 35 y/o F with morbid obesity and not able to obtain enteral access via NG or PEG at this facility. Patient to begin TPN. Pharmacy consulted to assist in managing electrolytes and glucose.   Plan:  Patient on SSI. Will f/u insulin requirements.   Electrolytes are WNL today. Will f/u AM labs.   Ulice Dash D 08/20/2015,3:05 PM

## 2015-08-20 NOTE — Progress Notes (Signed)
Pharmacy Antibiotic Note  Sherry Ramsey is a 35 y.o. female admitted on 08/13/2015 with pneumonia. Pharmacy has been consulted to dose vancomycin and piperacillin/tazobactam.   This is day #8 of antibiotics and patient is currently prescribed piperacillin/tazobactam and vancomycin. Respiratory culture is growing MRSA, providencia, and pseudomonas.  Plan: Continue zosyn 4.5gm IV Q8H extended infusion  Vancomycin trough drawn this morning was 14 mcg/mL on a regimen of vancomycin 1500 mg IV q12h. This was obtained prior to the 5th dose and should represent steady state. Will continue with current regimen even though slightly below goal of 15-20 mcg/mL as patient will likely accumulate due to morbid obesity. Will follow renal function closely.  Will check another vancomycin trough on 4/27 @ 1130 since high risk for accumulation.  Height: 5\' 4"  (162.6 cm) Weight: (!) 579 lb 13 oz (263 kg) IBW/kg (Calculated) : 54.7  Temp (24hrs), Avg:100.4 F (38 C), Min:99.8 F (37.7 C), Max:102.4 F (39.1 C)   Recent Labs Lab 08/16/15 0525 08/17/15 0050 08/17/15 0459 08/18/15 0431 08/19/15 0407 08/19/15 0933 08/20/15 0639  WBC 17.4*  --  18.3* 18.9* 19.9*  --  17.2*  CREATININE 0.89  --  0.85 0.81 0.84  --  0.87  VANCOTROUGH  --  9*  --   --   --  14  --     Estimated Creatinine Clearance: 194.8 mL/min (by C-G formula based on Cr of 0.87).    Allergies  Allergen Reactions  . Ibuprofen Other (See Comments)    Reaction: unknown    Antimicrobials this admission: Levaquin 4/19 >> 4/24 Vancomycin  4/19 >>  Zosyn 4/19 >>  Dose adjustments this admission: 4/20 Trough: 21mg c/ml Vancomycin 1500 Q8H -> 1250 Q8H 4/23 0100 vanc level 9. Changed to 1500 mg q 8 hours. Level before 4th new dose. Increasing cautiously due to possibility of accumulation. 4/23: see above, will redose at 1500mg  IV Q12H 4/25: VT 14 - continue regimen of 1500 q12h   Microbiology results: Sputum: MRSA, providencia,  pseudomonas MRSA PCR: positive 4/19 Blood cx: No growth 4 days  Thank you for allowing pharmacy to be a part of this patient's care.  Ulice Dash D, PharmD Clinical Pharmacist 08/20/2015 2:57 PM

## 2015-08-20 NOTE — Consult Note (Signed)
Surgical Consultation  08/20/2015  Sherry Ramsey is an 35 y.o. female.   CC resp failure  HPI: Is a massively morbidly obese female patient with a weight and BMI fairly detrimental to her health she has a tracheostomy and is on the ventilator minute for respiratory distress and failure. IV access is been poor and difficult and a PICC line is being placed. I was asked see the patient for enteral feeding options concerning possible gastrostomy tube placement as GI felt that she was not a good candidate for percutaneous endoscopic gastrostomy placement. Patient is sedated and ventilated with a tracheostomy tube and is awake but not alert therefore history is obtained from the chart and from nursing staff. View of systems is not obtainable  Past Medical History  Diagnosis Date  . Diabetes ([place])   . HTN (hypertension)   . HLD (hyperlipidemia)   . Chronic pain   . CHF (congestive heart failure) ([place])   . MDD (major depressive disorder) ([place])   . Anxiety   . Blood transfusion without reported diagnosis   . Cancer [facility])     Uterine    Past Surgical History  Procedure Laterality Date  . Tracheostomy      Family History  Problem Relation Age of Onset  . Diabetes Neg Hx   . Heart failure Neg Hx   . Hypertension Mother     Social History:  reports that she has quit smoking. She does not have any smokeless tobacco history on file. She reports that she does not drink alcohol or use illicit drugs.  Allergies:  Allergies  Allergen Reactions  . Ibuprofen Other (See Comments)    Reaction: unknown    Medications reviewed.   Review of Systems:   Review of Systems  Unable to perform ROS: acuity of condition     Physical Exam:  BP 135/83 mmHg  Pulse 95  Temp(Src) 102.4 F (39.1 C) (Oral)  Resp 31  Ht 5' 4"  (1.626 m)  Wt 579 lb 13 oz (263 kg)  BMI 99.48 kg/m2  SpO2 99%  LMP 07/27/2015  Physical Exam  Constitutional:  Massively obese with a BMI of 90  HENT:  Head:  Normocephalic and atraumatic.  Eyes: Right eye exhibits no discharge. Left eye exhibits no discharge. No scleral icterus.  Neck:  Tracheostomy in place and utilized  Cardiovascular: Normal rate and regular rhythm.   Abdominal: Soft. She exhibits no distension. There is no tenderness. There is no rebound and no guarding.  Massively obese  Musculoskeletal: She exhibits edema. She exhibits no tenderness.  Lymphadenopathy:    She has no cervical adenopathy.  Neurological: She is alert.  Skin: Skin is warm and dry.  Vitals reviewed.     Results for orders placed or performed during the hospital encounter of 08/13/15 (from the past 48 hour(s))  Glucose, capillary     Status: None   Collection Time: 08/18/15  5:10 PM  Result Value Ref Range   Glucose-Capillary 85 65 - 99 mg/dL  Glucose, capillary     Status: None   Collection Time: 08/18/15  7:48 PM  Result Value Ref Range   Glucose-Capillary 85 65 - 99 mg/dL  Glucose, capillary     Status: None   Collection Time: 08/19/15 12:47 AM  Result Value Ref Range   Glucose-Capillary 86 65 - 99 mg/dL  CBC     Status: Abnormal   Collection Time: 08/19/15  4:07 AM  Result Value Ref Range   WBC 19.9 (H)  3.6 - 11.0 K/uL   RBC 3.15 (L) 3.80 - 5.20 MIL/uL   Hemoglobin 7.9 (L) 12.0 - 16.0 g/dL   HCT 26.4 (L) 35.0 - 47.0 %   MCV 83.8 80.0 - 100.0 fL   MCH 25.0 (L) 26.0 - 34.0 pg   MCHC 29.8 (L) 32.0 - 36.0 g/dL   RDW 18.5 (H) 11.5 - 14.5 %   Platelets 395 150 - 440 K/uL  Basic metabolic panel     Status: Abnormal   Collection Time: 08/19/15  4:07 AM  Result Value Ref Range   Sodium 142 135 - 145 mmol/L   Potassium 3.9 3.5 - 5.1 mmol/L   Chloride 93 (L) 101 - 111 mmol/L   CO2 45 (H) 22 - 32 mmol/L   Glucose, Bld 83 65 - 99 mg/dL   BUN 28 (H) 6 - 20 mg/dL   Creatinine, Ser 0.84 0.44 - 1.00 mg/dL   Calcium 8.8 (L) 8.9 - 10.3 mg/dL   GFR calc non Af Amer >60 >60 mL/min   GFR calc Af Amer >60 >60 mL/min    Comment: (NOTE) The eGFR has been  calculated using the CKD EPI equation. This calculation has not been validated in all clinical situations. eGFR's persistently <60 mL/min signify possible Chronic Kidney Disease.    Anion gap 4 (L) 5 - 15  Glucose, capillary     Status: None   Collection Time: 08/19/15  4:11 AM  Result Value Ref Range   Glucose-Capillary 84 65 - 99 mg/dL  Blood gas, arterial     Status: Abnormal   Collection Time: 08/19/15  5:00 AM  Result Value Ref Range   FIO2 0.40    Delivery systems VENTILATOR    Mode PRESSURE REGULATED VOLUME CONTROL    VT 400 mL   LHR 22 resp/min   Peep/cpap 5.0 cm H20   pH, Arterial 7.36 7.350 - 7.450   pCO2 arterial 98 (HH) 32.0 - 48.0 mmHg    Comment: CRITICAL RESULT CALLED TO, READ BACK BY AND VERIFIED WITH: Dr Cheral Bay [id number] BY TMB    pO2, Arterial 74 (L) 83.0 - 108.0 mmHg   Bicarbonate 55.4 (H) 21.0 - 28.0 mEq/L   Acid-Base Excess 26.1 (H) 0.0 - 3.0 mmol/L   O2 Saturation 95.8 %   Patient temperature 37.0    Collection site LEFT RADIAL    Sample type ARTERIAL DRAW    Allens test (pass/fail) POSITIVE (A) PASS   Mechanical Rate 22   Glucose, capillary     Status: None   Collection Time: 08/19/15  7:45 AM  Result Value Ref Range   Glucose-Capillary 80 65 - 99 mg/dL  Vancomycin, trough     Status: None   Collection Time: 08/19/15  9:33 AM  Result Value Ref Range   Vancomycin Tr 14 10 - 20 ug/mL  Glucose, capillary     Status: None   Collection Time: 08/19/15 11:46 AM  Result Value Ref Range   Glucose-Capillary 83 65 - 99 mg/dL  Glucose, capillary     Status: Abnormal   Collection Time: 08/19/15  5:44 PM  Result Value Ref Range   Glucose-Capillary 102 (H) 65 - 99 mg/dL   Comment 1 Notify RN   Glucose, capillary     Status: Abnormal   Collection Time: 08/19/15  8:05 PM  Result Value Ref Range   Glucose-Capillary 102 (H) 65 - 99 mg/dL  Glucose, capillary     Status: Abnormal   Collection Time: 08/19/15  11:55 PM  Result Value Ref Range    Glucose-Capillary 109 (H) 65 - 99 mg/dL  Glucose, capillary     Status: Abnormal   Collection Time: 08/20/15  3:56 AM  Result Value Ref Range   Glucose-Capillary 119 (H) 65 - 99 mg/dL  CBC     Status: Abnormal   Collection Time: 08/20/15  6:39 AM  Result Value Ref Range   WBC 17.2 (H) 3.6 - 11.0 K/uL   RBC 3.21 (L) 3.80 - 5.20 MIL/uL   Hemoglobin 8.0 (L) 12.0 - 16.0 g/dL   HCT 25.8 (L) 35.0 - 47.0 %   MCV 80.5 80.0 - 100.0 fL   MCH 24.9 (L) 26.0 - 34.0 pg   MCHC 31.0 (L) 32.0 - 36.0 g/dL   RDW 18.0 (H) 11.5 - 14.5 %   Platelets 297 150 - 440 K/uL  Basic metabolic panel     Status: Abnormal   Collection Time: 08/20/15  6:39 AM  Result Value Ref Range   Sodium 140 135 - 145 mmol/L   Potassium 3.8 3.5 - 5.1 mmol/L   Chloride 93 (L) 101 - 111 mmol/L   CO2 39 (H) 22 - 32 mmol/L   Glucose, Bld 117 (H) 65 - 99 mg/dL   BUN 26 (H) 6 - 20 mg/dL   Creatinine, Ser 0.87 0.44 - 1.00 mg/dL   Calcium 8.5 (L) 8.9 - 10.3 mg/dL   GFR calc non Af Amer >60 >60 mL/min   GFR calc Af Amer >60 >60 mL/min    Comment: (NOTE) The eGFR has been calculated using the CKD EPI equation. This calculation has not been validated in all clinical situations. eGFR's persistently <60 mL/min signify possible Chronic Kidney Disease.    Anion gap 8 5 - 15  Magnesium     Status: None   Collection Time: 08/20/15  6:39 AM  Result Value Ref Range   Magnesium 2.0 1.7 - 2.4 mg/dL  Phosphorus     Status: None   Collection Time: 08/20/15  6:39 AM  Result Value Ref Range   Phosphorus 3.6 2.5 - 4.6 mg/dL  Glucose, capillary     Status: Abnormal   Collection Time: 08/20/15  7:44 AM  Result Value Ref Range   Glucose-Capillary 123 (H) 65 - 99 mg/dL  Glucose, capillary     Status: Abnormal   Collection Time: 08/20/15 11:39 AM  Result Value Ref Range   Glucose-Capillary 107 (H) 65 - 99 mg/dL   Dg Chest 1 View  08/20/2015  CLINICAL DATA:  PICC line placement. EXAM: CHEST 1 VIEW COMPARISON:  08/20/2015 FINDINGS: Right  PICC line has been placed. The tip is difficult to see but appears to be near the cavoatrial junction. Tracheostomy tube is unchanged. There is cardiomegaly with low lung volumes, vascular congestion and bilateral moderate pulmonary edema/CHF. IMPRESSION: Right PICC line tip difficult to visualize but appears to be near the cavoatrial junction. Moderate CHF. Electronically Signed   By: Rolm Baptise M.D.   On: 08/20/2015 11:24   Dg Chest 1 View  08/20/2015  CLINICAL DATA:  Dyspnea. EXAM: CHEST 1 VIEW COMPARISON:  08/19/2015, 08/18/2015 and 08/17/2015 FINDINGS: Tracheostomy tube in place. Chronic cardiomegaly. Pulmonary vascularity is less prominent than on the prior exam. No discrete infiltrates. No appreciable bone abnormality. Morbid obesity limits the exam. IMPRESSION: Improving pulmonary vascular congestion. Electronically Signed   By: Lorriane Shire M.D.   On: 08/20/2015 08:07   Dg Chest 1 View  08/19/2015  CLINICAL DATA:  Tracheostomy. EXAM: CHEST 1 VIEW COMPARISON:  08/18/2015. FINDINGS: Tracheostomy tube in stable position. Cardiomegaly with pulmonary vascular prominence and interstitial prominence again noted without interim change. Findings consistent congestive heart failure. Small left pleural effusion. No pneumothorax. IMPRESSION: 1. Tracheostomy tube in stable position. 2. Stable cardiomegaly with pulmonary vascular prominence interstitial prominence and small left pleural effusion. Findings consistent congestive heart failure. No interim change. Electronically Signed   By: Marcello Moores  Register   On: 08/19/2015 07:26   Dg Abd Portable 1v  08/18/2015  CLINICAL DATA:  Evaluate NG tube placement. Limited evaluation due to body habitus. EXAM: PORTABLE ABDOMEN - 1 VIEW COMPARISON:  Chest radiograph 08/18/2015 FINDINGS: Enteric tube projects over the left upper quadrant. Limited exam due to body habitus. Additional monitoring leads project over the left hemi abdomen. IMPRESSION: NG tube tip projects over  the left upper quadrant. Electronically Signed   By: Lovey Newcomer M.D.   On: 08/18/2015 17:16    Assessment/Plan:  I was asked see this patient for potential of placing a gastrostomy tube as PEG could not be placed. For the same reasons that a PEG cannot be placed a gastrostomy tube is a very poor option in this patient mostly due to the extreme risks from complications. She gastrostomy tube leak into the intra-abdominal cavity or into the subcutaneous tissues tissues this patient would be at extreme risk for death from sepsis and chemical peritonitis. I would not recommend gastrostomy tube placement in this patient at this time.  Florene Glen, MD, FACS

## 2015-08-20 NOTE — Plan of Care (Signed)
Problem: Safety: Goal: Ability to remain free from injury will improve Outcome: Progressing No fall or injury throughout shift. Pt remains on ventilator and under sedation at this time. No attempts OOB throughout shift.   Problem: Pain Managment: Goal: General experience of comfort will improve Outcome: Progressing Pt remains on IV fentanyl infusion for sedation/pain mgmt. Rate increased to 216mcg/hr due to increasing agitation from pt. Pt with no s/s of pain at this time. Will continue to monitor.   Problem: Skin Integrity: Goal: Risk for impaired skin integrity will decrease Pt with no skin breakdown at this time. Pt continues to be a Q2hr turn and require maximum assistance with repositioning. Will continue to monitor.

## 2015-08-20 NOTE — Care Management (Signed)
Informed  that transfer to [facility] has been initiated. Patient will transfer when bed is available

## 2015-08-20 NOTE — Progress Notes (Signed)
Pt anxious and restless most of the night, requiring increased sedation and PRN medications.  Pt sedated with fentanyl and precedex; given PRN versed x2 and ativan x1.  Pt will open eyes to speech and will follow commands, however gets frustrated, anxious, and tearful quickly.  OB/GYN MD in to see pt last night; recommended consult to [facility] Oncologist for further evaluation.  Pt is SR on cardiac monitor, remains on vent at 40% fiO2 and tachypneic.  Low grade fever with Tmax 100.2.  Pt continues to have heavy, dark red, vaginal bleeding with several large clots.  Report given to oncoming RN.

## 2015-08-20 NOTE — Progress Notes (Addendum)
Nutrition Follow-up  DOCUMENTATION CODES:   Morbid obesity  INTERVENTION:  -Discussed nutritional poc during ICU rounds with MD Ashby Dawes; pt receiving triple lumen PICC line this AM for IV meds and TPN. Multiple attempts at NG placement have been unsuccessful; per report, pt would be unable to have NG/dobhoff tube placed by IR as pt exceeds the weight limit for the bed need during procedure. Pt has been seen by GI and unable to have PEG tube placed endoscopically. Surgery consult has been placed; RD spoke with MD Burt Knack regarding possibility of G-tube placement; MD has not seen pt and plans to evaluate pt today but based on our discussion, surgical G-tube not an option at this facility. There has been discussion of possible transfer to tertiary care facility. Agree with consideration of transferfor further care if aggressive care is wanted. In the meantime, per discussion with MD Ashby Dawes, plan to start TPN via PICC line placed today. Agree with TPN at present as been without adequate nutrition now for at least 7 days and no access for TF and remains sedated on vent -Recommend starting 5%AA/15%Dextrose at rate of 50 ml/hr today for first 24 hours; initial goal rate of 100 ml/hr providing 2400 mL of fluid, 120 g protein and 1704 kcals. Will assess for titration on follow-up. Triple lumen PICC being placed this AM; per Margreta Journey RN, pt will have dedicated port for TPN. Pt will likely need addition of 20% lipids but will reassess this as needed. Pharmacy consult for electrolyte and glucose management; TPN panel in AM, daily weights (if possible), I/O q shift). Continue to assess  NUTRITION DIAGNOSIS:   Inadequate oral intake related to acute illness as evidenced by NPO status.  Being addressed via TPN  GOAL:   Provide needs based on ASPEN/SCCM guidelines  MONITOR:   TF tolerance, Vent status, Labs, Weight trends, I & O's, Skin  REASON FOR ASSESSMENT:   Consult New  TPN/TNA  ASSESSMENT:   35 yo female admitted with respiratory distress with hx of chronic trach due to OSA/COPD/CHF and symptomatic anemia with chronic vaginal bleeding diagnosed with endometrial cancer on 07/31/15.   Per Staci RN, NG tube was attempted again yesterday but unable to pass even with multiple attempts, pt even started bleeding from nare  Pt remains on vent support via trach, pt very anxious; now febrile, continues with heavy, dark red bleeding with several large clots; pt was re-evaluated by Ob/Gyn yesterday for  treatment regimen for endometrial cancer as pt unable to take po/no NG  Patient is currently intubated on ventilator support MV: 12.3 L/min Temp (24hrs), Avg:100.1 F (37.8 C), Min:99.8 F (37.7 C), Max:100.2 F (37.9 C)  Pt receiving triple lumen PICC line this AM  Renal function good, UOP >3 L  Diet Order: NPO, pt has been without adequate nutrition since admission  Skin:  Reviewed, no issues  Last BM:  08/15/15   Labs: phoshorus/magnesium/sodium/potassium wdl, [place] 0.87  Glucose Profile:   Recent Labs  08/20/15 0356 08/20/15 0744 08/20/15 1139  GLUCAP 119* 123* 107*   Meds: ss novolog, lasix, precedex, fentanyl, megace via vaginal route  Height:   Ht Readings from Last 1 Encounters:  08/13/15 5\' 4"  (1.626 m)    Weight:   Wt Readings from Last 1 Encounters:  08/20/15 579 lb 13 oz (263 kg)    Ideal Body Weight:  60 kg  BMI:  Body mass index is 99.48 kg/(m^2).  Estimated Nutritional Needs:   Kcal:  1320-1500 kcals (22-25 kcals/kg IBW)  or 2200-2500 kcals (60-70% of calories using [place] for BMI >30) per ASPEN guidelines  Protein:  >/= 150 g (2.5 g/kg)   Fluid:  >/= 1.5 L per day  EDUCATION NEEDS:   No education needs identified at this time  [place], [place], [place] [phone] Pager  [phone] Weekend/On-Call Pager

## 2015-08-20 NOTE — Progress Notes (Signed)
RN requested picc line as pt has only one peripheral with multiple medications infusing. Pt evaluated, she is super-obese and will require long term vascular access for abx as well as likely tpn. She will be an extremely challenging access for central line given her size-- she would  Be at higher risk of pneumothorax and bleeding complications due to this. Picc line would be the least invasive and safest method to establish long term vascular access. OK to proceed with picc placement.  Ashby Dawes M.D.  08/20/2015

## 2015-08-20 NOTE — Progress Notes (Signed)
[facility] Progess Note    PULMONARY A: Acute on Chronic Hypercapnic respiratory failure Chronic tracheostomy Obesity hypoventilation syndrome Possible viral syndrome/bronchitis versus HCAP P:  -Currently on tidal volume 400/22/5/40% -ABG reviewed today appears to show severe hypercapnia, appears compensated on the ventilator. -Nebulized bronchodilator and steriods -Daily CXR -reviewed today,improving vascular congestion -continue lasix 40mg  BID -Continue empiric antibiotics    CARDIOVASCULAR A:  Cardiomegaly Congestive heart failure-last echo 4/10 with LVEF of 65% and grade 1 diastolic dysfunction HTN HLD  P:  -continue IV diuresis -Continue coreg after peg/ng tube placement -Hold lisinopril -Hydralazine prn for SBP>160, currently her blood pressure appears to be adequately controlled.  RENAL A:  No acute issues, creatinine normal. P:  -Foley to gravity -Monitor I/O -Monitor and replace electrolytes  GASTROINTESTINAL A:  No acute issues P:  Gastroentrology consulted general surgery consulted for peg placement No enteral access present as GI/ Surgery unable due to morbid obesity -PPI for stress ulcer phrophylaxis  HEMATOLOGIC A:  Vaginal bleed secondary to endometrial adenocarcinoma Anemia - blood loss, due to vaginal bleeding P:  -OB/gyn reconsulted,  -not received megace due to no enteral tube in place -Follow up as an outpatient with [facility] Gyn/Onc  INFECTIOUS A:  HCAP versus bronchitis in a high risk trached patient P:  -Empiric antibiotics  Antibiotics: Levaquin 4/19 >>08/17/15  Vancomycin 4/19 >>  Zosyn 4/19 >>  Cultures: 4/19. Blood culture: Pending 4/19. MRSA screen: Positive 4/19. Sputum culture: MRSA  ENDOCRINE A:  Type 2 DM P:  -Blood glucose monitoring with SSI coverage, glucose, appears controlled today.   Genitourinary A:  Endometerial Adenocarcinoma  NEUROLOGIC A:   Vent sedation P:  RASS goal: -1 to -2 -precedexl, fentanyl and versed for sedation and comfort  Disposition and Family update: Plan for [facility] referral initiated 04/21. No family at bedside    Best Practice: Code Status: Full Diet: NPO GI prophylaxis: PPI-will change to an H2 blocker. VTE prophylaxis: SCD's /no pharmacologic VTE prophylaxis due to vaginal bleeding   ---------------------------------------   ----------------------------------------   Name: Sherry Ramsey MRN: [id number] DOB: April 06, 1981    ADMISSION DATE:  08/13/2015   SUBJECTIVE:   Patient remains, on vent .  No acute events overnight. Patient opens her her spontaneously but does not follow any commands.   VITAL SIGNS: Temp:  [99.8 F (37.7 C)-102.4 F (39.1 C)] 102.4 F (39.1 C) (04/26 1200) Pulse Rate:  [78-103] 95 (04/26 1400) Resp:  [21-34] 31 (04/26 1400) BP: (114-141)/(67-89) 135/83 mmHg (04/26 1400) SpO2:  [90 %-100 %] 99 % (04/26 1400) FiO2 (%):  [40 %] 40 % (04/26 1359) Weight:  [263 kg (579 lb 13 oz)] 263 kg (579 lb 13 oz) (04/26 0724) HEMODYNAMICS:   VENTILATOR SETTINGS: Vent Mode:  [-] PRVC FiO2 (%):  [40 %] 40 % Set Rate:  [22 bmp] 22 bmp Vt Set:  [400 mL] 400 mL PEEP:  [5 cmH20] 5 cmH20 INTAKE / OUTPUT:  Intake/Output Summary (Last 24 hours) at 08/20/15 1417 Last data filed at 08/20/15 1200  Gross per 24 hour  Intake 2185.04 ml  Output   3025 ml  Net -839.96 ml    PHYSICAL EXAMINATION: Physical Examination:   VS: BP 135/83 mmHg  Pulse 95  Temp(Src) 102.4 F (39.1 C) (Oral)  Resp 31  Ht 5\' 4"  (1.626 m)  Wt 263 kg (579 lb 13 oz)  BMI 99.48 kg/m2  SpO2 99%  LMP 07/27/2015  General Appearance: morbidly obese AA female sedated and vented  and appears to be in no acute distress at this time. Neuro:,  Sedated on vent ,no discharge noted HEENT:Atraumatic, normocephalic,, no discharge noted  Pulmonary: diminished bilaterally,no  crackles,wheezes rhonchi  noted CardiovascularNormal S1,S2.  No m/r/g.   Abdomen: Obese,Benign, Soft, non-tender. Renal:  No costovertebral tenderness  GU:  Not performed at this time. Endocrine: No evident thyromegaly  Skin:   warm, no rashes, no ecchymosis  Extremities: normal, no cyanosis, clubbing.   LABS:   LABORATORY PANEL:   CBC  Recent Labs Lab 08/20/15 0639  WBC 17.2*  HGB 8.0*  HCT 25.8*  PLT 297    Chemistries   Recent Labs Lab 08/20/15 0639  NA 140  K 3.8  CL 93*  CO2 39*  GLUCOSE 117*  BUN 26*  CREATININE 0.87  CALCIUM 8.5*  MG 2.0  PHOS 3.6     Recent Labs Lab 08/19/15 1744 08/19/15 2005 08/19/15 2355 08/20/15 0356 08/20/15 0744 08/20/15 1139  GLUCAP 102* 102* 109* 119* 123* 107*    Recent Labs Lab 08/16/15 0416 08/18/15 0354 08/19/15 0500  PHART 7.34* 7.35 7.36  PCO2ART 91* 97* 98*  PO2ART 74* 74* 74*   No results for input(s): AST, ALT, ALKPHOS, BILITOT, ALBUMIN in the last 168 hours.  Cardiac Enzymes  Recent Labs Lab 08/13/15 1524  TROPONINI 0.04*    RADIOLOGY:  Dg Chest 1 View  08/20/2015  CLINICAL DATA:  PICC line placement. EXAM: CHEST 1 VIEW COMPARISON:  08/20/2015 FINDINGS: Right PICC line has been placed. The tip is difficult to see but appears to be near the cavoatrial junction. Tracheostomy tube is unchanged. There is cardiomegaly with low lung volumes, vascular congestion and bilateral moderate pulmonary edema/CHF. IMPRESSION: Right PICC line tip difficult to visualize but appears to be near the cavoatrial junction. Moderate CHF. Electronically Signed   By: Rolm Baptise M.D.   On: 08/20/2015 11:24   Dg Chest 1 View  08/20/2015  CLINICAL DATA:  Dyspnea. EXAM: CHEST 1 VIEW COMPARISON:  08/19/2015, 08/18/2015 and 08/17/2015 FINDINGS: Tracheostomy tube in place. Chronic cardiomegaly. Pulmonary vascularity is less prominent than on the prior exam. No discrete infiltrates. No appreciable bone abnormality. Morbid obesity limits the exam.  IMPRESSION: Improving pulmonary vascular congestion. Electronically Signed   By: Lorriane Shire M.D.   On: 08/20/2015 08:07   Dg Chest 1 View  08/19/2015  CLINICAL DATA:  Tracheostomy. EXAM: CHEST 1 VIEW COMPARISON:  08/18/2015. FINDINGS: Tracheostomy tube in stable position. Cardiomegaly with pulmonary vascular prominence and interstitial prominence again noted without interim change. Findings consistent congestive heart failure. Small left pleural effusion. No pneumothorax. IMPRESSION: 1. Tracheostomy tube in stable position. 2. Stable cardiomegaly with pulmonary vascular prominence interstitial prominence and small left pleural effusion. Findings consistent congestive heart failure. No interim change. Electronically Signed   By: Marcello Moores  Register   On: 08/19/2015 07:26   Dg Abd Portable 1v  08/18/2015  CLINICAL DATA:  Evaluate NG tube placement. Limited evaluation due to body habitus. EXAM: PORTABLE ABDOMEN - 1 VIEW COMPARISON:  Chest radiograph 08/18/2015 FINDINGS: Enteric tube projects over the left upper quadrant. Limited exam due to body habitus. Additional monitoring leads project over the left hemi abdomen. IMPRESSION: NG tube tip projects over the left upper quadrant. Electronically Signed   By: Lovey Newcomer M.D.   On: 08/18/2015 17:16     Note: Talked to Dr. Koleen Nimrod at [facility]) regarding her transfer to [facility] for evaluation of her evaluated her for her endometrial cancer and getting her an enteral access.  [facility]  Pt seen and examined with NP, agree with assessment and plan. No significant change on physical exam today, pt remains vent dependent. GI, surgery, IR unable to provide feeding tube, in addition obGyn recommends obtaining tertiary recs regarding treatement of her uterine cancer, therefore will seek transfer to address these issues.  Marda Stalker, M.D.  08/20/2015 Critical Care Attestation.  I have personally obtained a  history, examined the patient, evaluated laboratory and imaging results, formulated the assessment and plan and placed orders. The Patient requires high complexity decision making for assessment and support, frequent evaluation and titration of therapies, application of advanced monitoring technologies and extensive interpretation of multiple databases. The patient has critical illness that could lead imminently to failure of 1 or more organ systems and requires the highest level of physician preparedness to intervene.  Critical Care Time devoted to patient care services described in this note is 35 minutes and is exclusive of time spent in procedures.

## 2015-08-20 NOTE — Discharge Summary (Signed)
Physician Discharge Summary  Patient ID: Sherry Ramsey MRN: [id number] DOB/AGE: 1980-05-16 35 y.o.  Admit date: 08/13/2015 Discharge date: 08/20/2015    Discharge Diagnoses: Acute on Chronic Hypercapnic respiratory failure Chronic tracheostomy Obesity hypoventilation syndrome Possible viral syndrome/bronchitis versus HCAP Cardiomegaly Congestive heart failure-last echo 4/10 with LVEF of 65% and grade 1 diastolic dysfunction HTN HLD Vaginal bleed secondary to endometrial adenocarcinoma Anemia - blood loss, due to vaginal bleeding HCAP versus bronchitis in a high risk trached patient Type 2 DM                                                                       DISCHARGE PLAN BY DIAGNOSIS     PULMONARY A: Acute on Chronic Hypercapnic respiratory failure Chronic tracheostomy Obesity hypoventilation syndrome Possible viral syndrome/bronchitis versus HCAP P:  -Currently on tidal volume 400/22/5/40% -ABG reviewed today appears to show severe hypercapnia, appears compensated on the ventilator. -Nebulized bronchodilator and steriods -Daily CXR -reviewed today, ,ontinued bibasilar atelectasis.small left pleural effusion, pulmonary edema -Increased lasix 40mg  BID -Continue empiric antibiotics  CARDIOVASCULAR A:  Cardiomegaly Congestive heart failure-last echo 4/10 with LVEF of 65% and grade 1 diastolic dysfunction HTN HLD  P:  -continue IV diuresis -Continue coreg after peg/ng tube placement -Hold lisinopril -Hydralazine prn for SBP>160, currently her blood pressure appears to be adequately controlled. RENAL A:  No acute issues, creatinine normal. P:  -Foley to gravity -Monitor I/O -Monitor and replace electrolytes  GASTROINTESTINAL A:  No acute issues P:  Gastroentrology consulted for consideration of peg tube placement -PPI for stress ulcer phrophylaxis  HEMATOLOGIC A:  Vaginal bleed secondary to endometrial adenocarcinoma Anemia - blood loss,  due to vaginal bleeding P:  -OB/gyn reconsulted,  -not received megace due to no enteral tube in place -Follow up as an outpatient with [facility] Gyn/Onc  INFECTIOUS A:  HCAP versus bronchitis in a high risk trached patient P:  -Empiric antibiotics  Antibiotics: Levaquin 4/19 >>08/17/15  Vancomycin 4/19 >>  Zosyn 4/19 >>  Cultures: 4/19. Blood culture: Pending 4/19. MRSA screen: Positive 4/19. Sputum culture: MRSA  ENDOCRINE A:  Type 2 DM P:  -Blood glucose monitoring with SSI coverage, glucose, appears controlled today.   Genitourinary A:  Endometerial Adenocarcinoma  NEUROLOGIC A:  Vent sedation P:  RASS goal: -1 to -2 Precedex, fentanyl and versed for sedation and comfort               DISCHARGE SUMMARY   Sherry Ramsey is a 35 y.o. y/o female with a PMH of COPD , congestive heart failure, hypertension, hyperlipidemia, chronic systolic heart failure, elevated troponins,Anemia, acute respiratory failure with hypoxia, chronic trach, patient presented to ED on 4/19 with acute shortness of breath. Patient was started on broad-spectrum antibiotics. She was suctioned and was put on trach collar. On 4/19 patient was moved to the ICU. PCCMTeam was consulted on her on 419 due to complaints of respiratory distress.  Patient was noncompliant with her trach collar and was refusing to wear her trach collar. Around 4 PM on the same day patient was found to be having increased shortness of breath, diaphragmatically respirations, and had increased difficulty to wake her up. Patient was placed on the vent, patient woke up agitated right after 10 minutes  of placing her on the vent, her ABG on 4/20 was 7.23/95/90/39.8. She was found to be anemic with hemoglobin of 8 therefore on 4/19 OB/GYN was consulted due to the fact that she was diagnosed with FIGO grade 1 endometrial adenocarcinoma by [facility] gynecologist. On 4/20 patient was tried on trach collar.  Patient was found to be  lethargic and was placed on the vent with a rate of 5, PEEP 5 ,tidal volume -450, FiO2 of 40%. On multiple occasions tried NG placement but without success. Patient has not received any nutrition since 4/19. Gastroenterology, general surgery had been consulted for evaluationof PEG peg tube placement. Patient continues to bleed vaginally approximately 100-200 mL as per the report from the nurses. Her H&H remained stable and did not receive any transfusion.           Ventilator settings: PRVC mode, rate 22 ,FiO2 50% ,tidal volume 500   ABG-4/25 Results for Sherry, Ramsey (MRN [id number]) as of 08/20/2015 12:43  Ref. Range 08/19/2015 05:00  Sample type Unknown ARTERIAL DRAW  Delivery systems Unknown VENTILATOR  FIO2 Unknown 0.40  Mode Unknown PRESSURE REGULATE...  VT Latest Units: mL 400  Peep/cpap Latest Units: cm H20 5.0  pH, Arterial Latest Ref Range: 7.350-7.450  7.36  pCO2 arterial Latest Ref Range: 32.0-48.0 mmHg 98 (HH)  pO2, Arterial Latest Ref Range: 83.0-108.0 mmHg 74 (L)  Bicarbonate Latest Ref Range: 21.0-28.0 mEq/L 55.4 (H)  Acid-Base Excess Latest Ref Range: 0.0-3.0 mmol/L 26.1 (H)  O2 Saturation Latest Units: % 95.8  Patient temperature Unknown 37.0  Collection site Unknown LEFT RADIAL  Allens test (pass/fail) Latest Ref Range: PASS  POSITIVE (A)   ABG-4/24Results for Sherry, Ramsey (MRN [id number]) as of 08/20/2015 12:43  Ref. Range 08/18/2015 03:54  Sample type Unknown ARTERIAL DRAW  Delivery systems Unknown VENTILATOR  FIO2 Unknown 40.00  Mode Unknown PRESSURE REGULATE...  VT Latest Units: mL 400  pH, Arterial Latest Ref Range: 7.350-7.450  7.35  pCO2 arterial Latest Ref Range: 32.0-48.0 mmHg 97 (HH)  pO2, Arterial Latest Ref Range: 83.0-108.0 mmHg 74 (L)  Bicarbonate Latest Ref Range: 21.0-28.0 mEq/L 53.6 (H)  Acid-Base Excess Latest Ref Range: 0.0-3.0 mmol/L 24.7 (H)  O2 Saturation Latest Units: % 94.0  Patient temperature Unknown 37.0  Collection site Unknown LEFT RADIAL   Allens test (pass/fail) Latest Ref Range: PASS  PASS   SIGNIFICANT DIAGNOSTIC STUDIES 4/19 echo concerning forSuboptimal study with normal EF and mild diastolic dysfunction.  Unable to evaluate valvular disease.  SIGNIFICANT EVENTS 4/19- initiated vent  MICRO DATA  4/19. Blood culture: Pending 4/19. MRSA screen: Positive 4/19. Sputum culture: MRSA*  ANTIBIOTICS Levaquin 4/19 >>08/17/15  Vancomycin 4/19 >>  Zosyn 4/19 >>   CONSULTS 4/19 Critical care>> 4/25 gastroenterology>> 4/25 obstetrics/gynecology>>  TUBES / LINES Tracheostomy - date unknown   4/26 right upper arm PICC   Discharge Exam: General Appearance: No distress -super obese. Neuro:without focal findings, mental status normal. HEENT: PERRLA, EOM intact. Pulmonary: normal breath sounds , decreased bilaterally.  CardiovascularNormal S1,S2. No m/r/g.  Abdomen: Benign, Soft, non-tender. Renal: No costovertebral tenderness  GU: Not performed at this time. Endocrine: No evident thyromegaly. Skin: warm, no rashes, no ecchymosis  Extremities: normal, no cyanosis, clubbing.   Filed Vitals:   08/20/15 0900 08/20/15 1000 08/20/15 1100 08/20/15 1140  BP: 136/78 137/77    Pulse: 98 98 99   Temp:      TempSrc:      Resp: 29 33 31   Height:  Weight:      SpO2: 99% 99% 97% 97%     Discharge Labs  BMET  Recent Labs Lab 08/15/15 1504 08/16/15 0525 08/17/15 0459 08/18/15 0431 08/19/15 0407 08/20/15 0639  NA 138 140 141 142 142 140  K 4.5 4.1 4.3 4.4 3.9 3.8  CL 94* 94* 93* 94* 93* 93*  CO2 42* 41* 42* 43* 45* 39*  GLUCOSE 109* 83 114* 115* 83 117*  BUN 27* 29* 24* 25* 28* 26*  CREATININE 0.92 0.89 0.85 0.81 0.84 0.87  CALCIUM 8.9 8.5* 8.6* 8.7* 8.8* 8.5*  MG 2.2 2.0  --  2.5*  --  2.0  PHOS 2.7 2.9  --  3.8  --  3.6    CBC  Recent Labs Lab 08/18/15 0431 08/19/15 0407 08/20/15 0639  HGB 7.3* 7.9* 8.0*  HCT 24.1* 26.4* 25.8*  WBC 18.9* 19.9* 17.2*  PLT 369 395 297     Anti-Coagulation No results for input(s): INR in the last 168 hours.          Medication List    ASK your doctor about these medications        atorvastatin 10 MG tablet  Commonly known as:  LIPITOR  Take 10 mg by mouth at bedtime.     butalbital-acetaminophen-caffeine 50-325-40 MG tablet  Commonly known as:  FIORICET, ESGIC  Take 1 tablet by mouth every 4 (four) hours as needed for headache.     carvedilol 6.25 MG tablet  Commonly known as:  COREG  Take 6.25 mg by mouth 2 (two) times daily with a meal.     clonazePAM 0.5 MG tablet  Commonly known as:  KLONOPIN  Take 1 mg by mouth at bedtime.     diphenhydrAMINE 25 MG tablet  Commonly known as:  BENADRYL  Take 25 mg by mouth every 6 (six) hours as needed for itching.     furosemide 40 MG tablet  Commonly known as:  LASIX  Take 40 mg by mouth.     guaiFENesin 600 MG 12 hr tablet  Commonly known as:  MUCINEX  Take 600 mg by mouth 2 (two) times daily as needed for cough.     HYDROmorphone 2 MG tablet  Commonly known as:  DILAUDID  Take 2 mg by mouth every 4 (four) hours as needed for severe pain.  Ask about: Should I take this medication?     lisinopril 5 MG tablet  Commonly known as:  PRINIVIL,ZESTRIL  Take 5 mg by mouth daily.     medroxyPROGESTERone 10 MG tablet  Commonly known as:  PROVERA  Take 10 mg by mouth daily.     Melatonin 5 MG Tabs  Take 10 mg by mouth at bedtime.     multivitamin with minerals Tabs tablet  Take 1 tablet by mouth daily.     senna 8.6 MG tablet  Commonly known as:  SENOKOT  Take 2 tablets by mouth 2 (two) times daily.     sertraline 50 MG tablet  Commonly known as:  ZOLOFT  Take 50 mg by mouth daily.     topiramate 25 MG tablet  Commonly known as:  TOPAMAX  Take 25 mg by mouth 2 (two) times daily.     traZODone 50 MG tablet  Commonly known as:  DESYREL  Take 200 mg by mouth at bedtime.         Disposition: [facility]  Time spent on disposition:  Greater than  45 minutes.   Bincy Varughese,AG-ACNP  Pulmonary & Critical Care

## 2015-08-20 NOTE — Plan of Care (Signed)
Problem: Fluid Volume: Goal: Ability to maintain a balanced intake and output will improve Outcome: Progressing Pt being diuresed with IV furosemide and put out ~4L of urine.   Problem: Nutrition: Goal: Adequate nutrition will be maintained Outcome: Progressing TPN initiated today

## 2015-08-20 NOTE — Progress Notes (Signed)
GI Note:  Would not be able to transilluminate with this body habitus so would be unable to do G tube endoscopically.   Recs: - contact interventional radiology or surgery.

## 2015-08-20 NOTE — Progress Notes (Signed)
eLink Physician-Brief Progress Note Patient Name: Alyra Mikelson DOB: October 18, 1980 MRN: [id number]   Date of Service  08/20/2015  HPI/Events of Note  Transferring to [facility].  [facility] want an ABG.  eICU Interventions  ABG ordered.     Intervention Category Major Interventions: Other:  Erick Oxendine 08/20/2015, 8:18 PM

## 2015-08-20 NOTE — Progress Notes (Signed)
Patient being transferred to [facility] MICU via [place], report called by Margreta Journey, RN on day shift.  Report given by this RN to [employer].  EMTALA and belongings as well as extra trach sent with patient.  Vital signs were stable, patient was tolerating ventilator and sedation on Fentanyl and Precedex well.

## 2015-08-21 NOTE — Care Management (Signed)
Was transferred to [facility] for the PEG tube intervention

## 2015-08-23 LAB — CULTURE, BLOOD (ROUTINE X 2)
Culture: NO GROWTH
Culture: NO GROWTH

## 2017-04-28 IMAGING — CR DG CHEST 2V
2 series · 2 of 2 positions shown · non-contrast
Comparison: None.

CLINICAL DATA: Shortness of breath.

EXAM:
CHEST  2 VIEW

[chest pa]
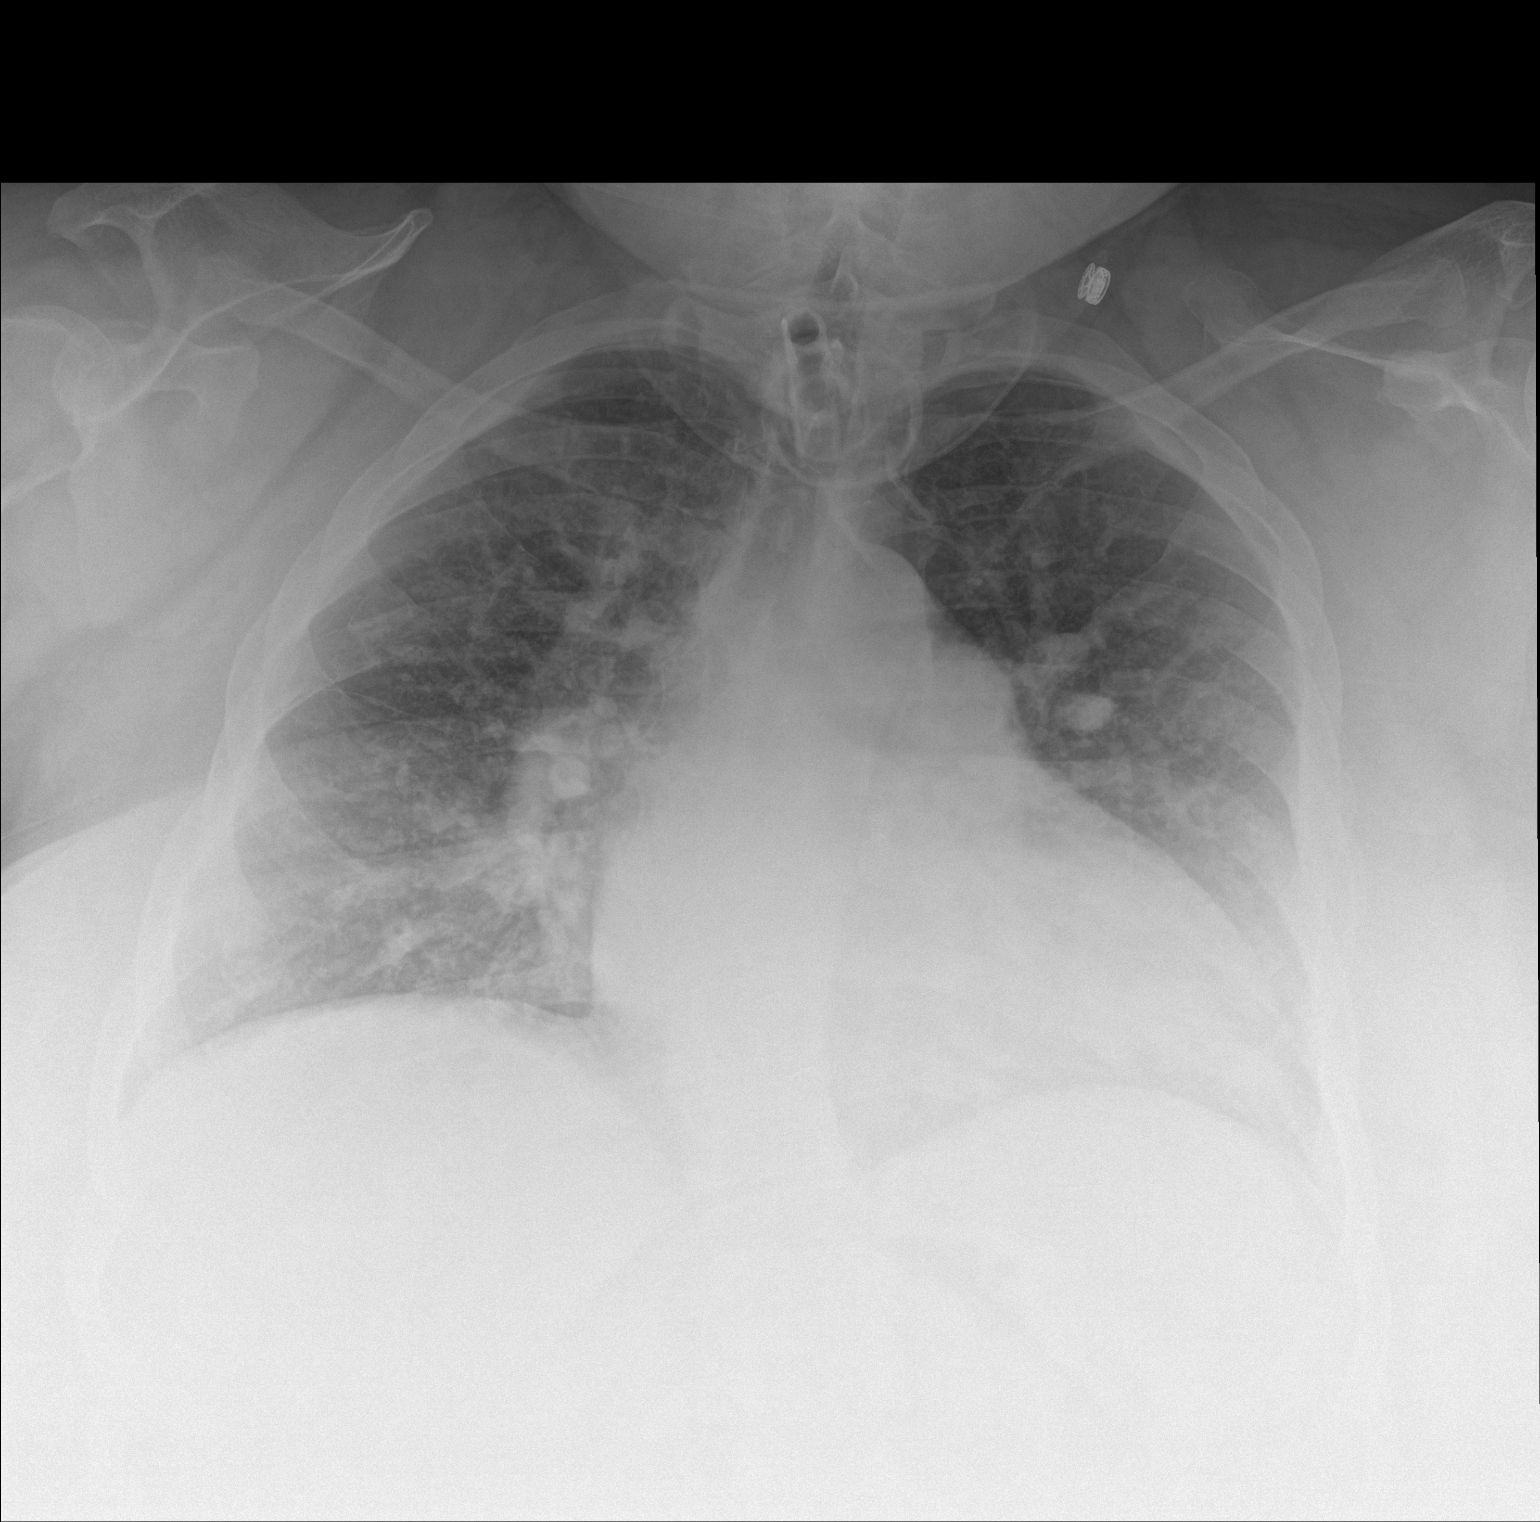

[chest lat]
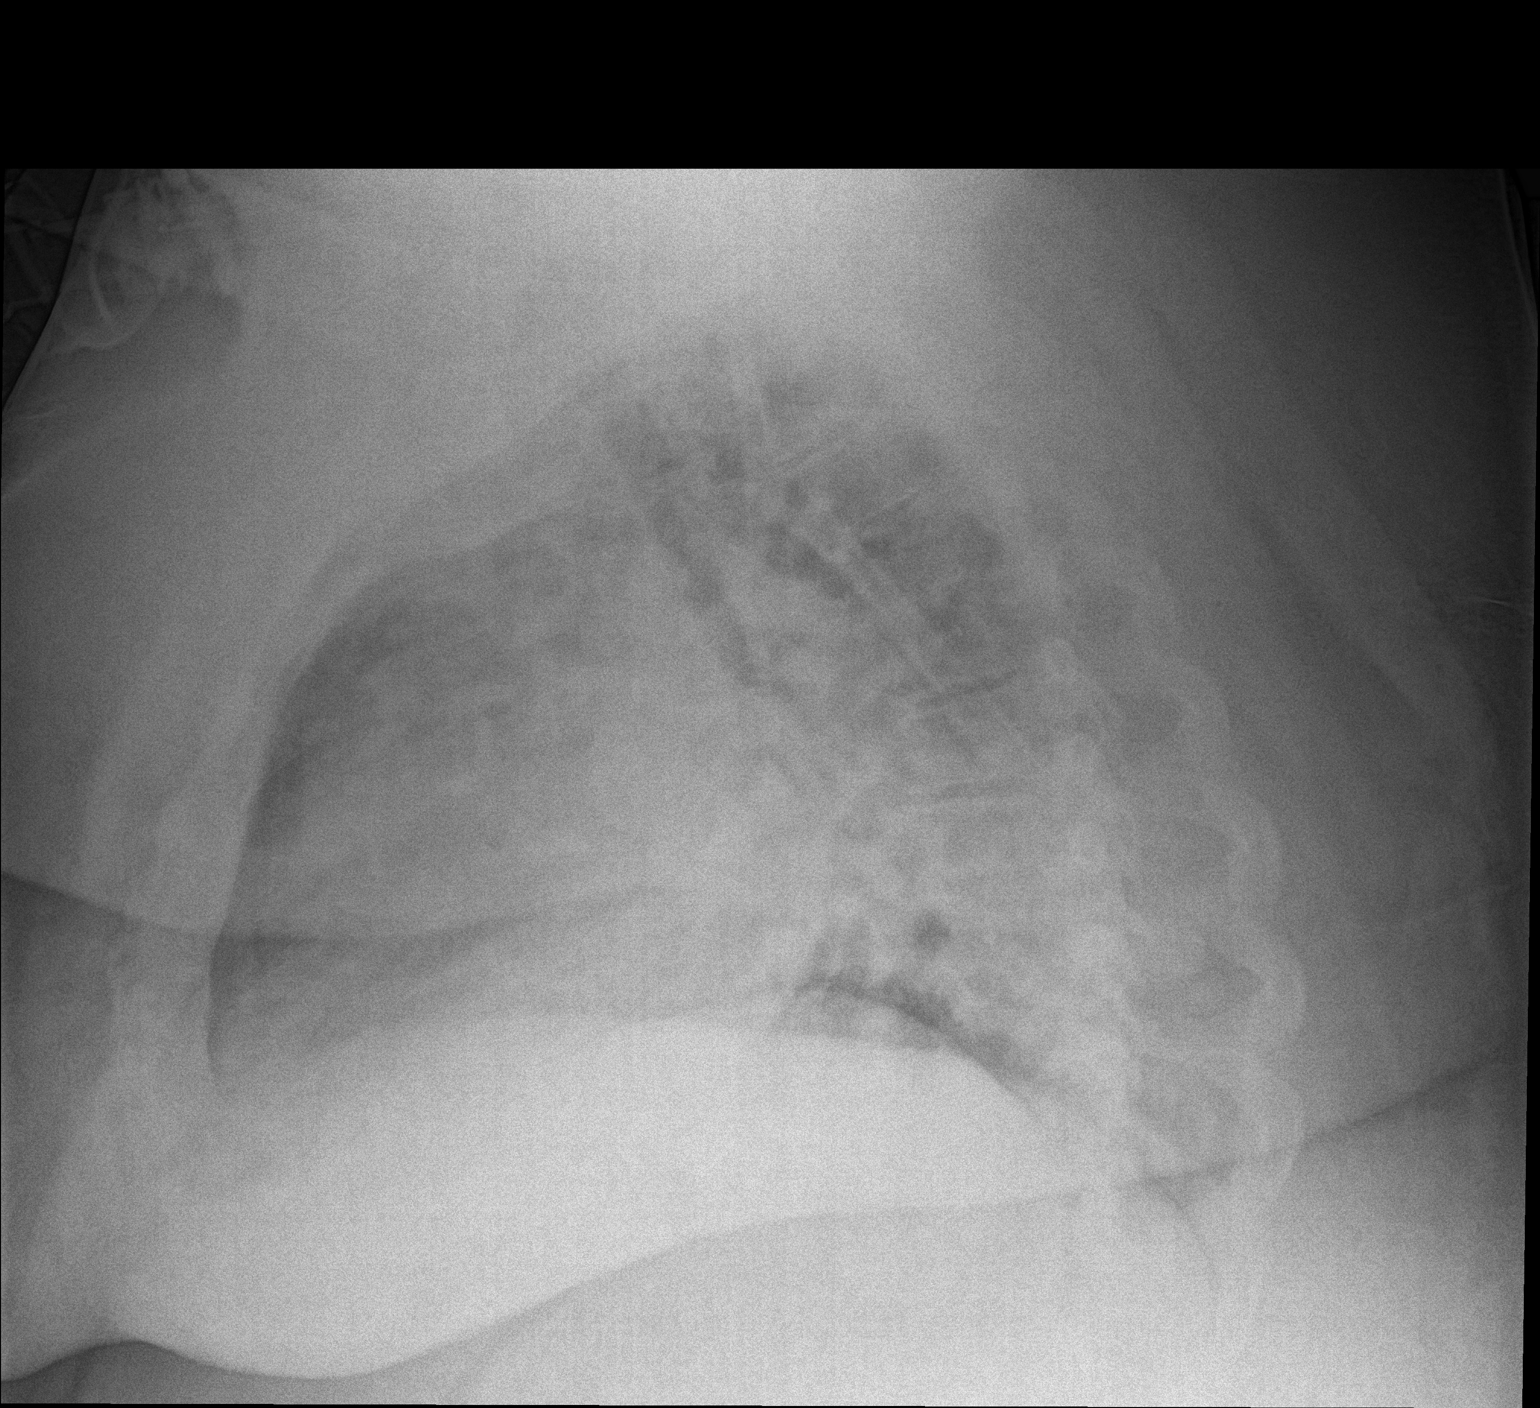

[2 of 2 positions shown; findings below may reference images not displayed]

FINDINGS: The heart size is moderately enlarged. There is a tracheostomy tube
with tip above the carina. Pulmonary vascular congestion is noted.
No airspace consolidation. No pleural effusion. Scoliosis deformity
involves the thoracic spine.
IMPRESSION: 1. Cardiac enlargement and pulmonary vascular congestion.

## 2017-05-08 IMAGING — DX DG CHEST 1V PORT
1 series · 2 of 2 positions shown · non-contrast
Comparison: 08/03/2015

CLINICAL DATA: Shortness of breath. History of CHF and
hypertension.

EXAM:
PORTABLE CHEST 1 VIEW

[Series 1: chest ap · 0.14mm/px · 2 of 2 slices shown]
[im 1/2]
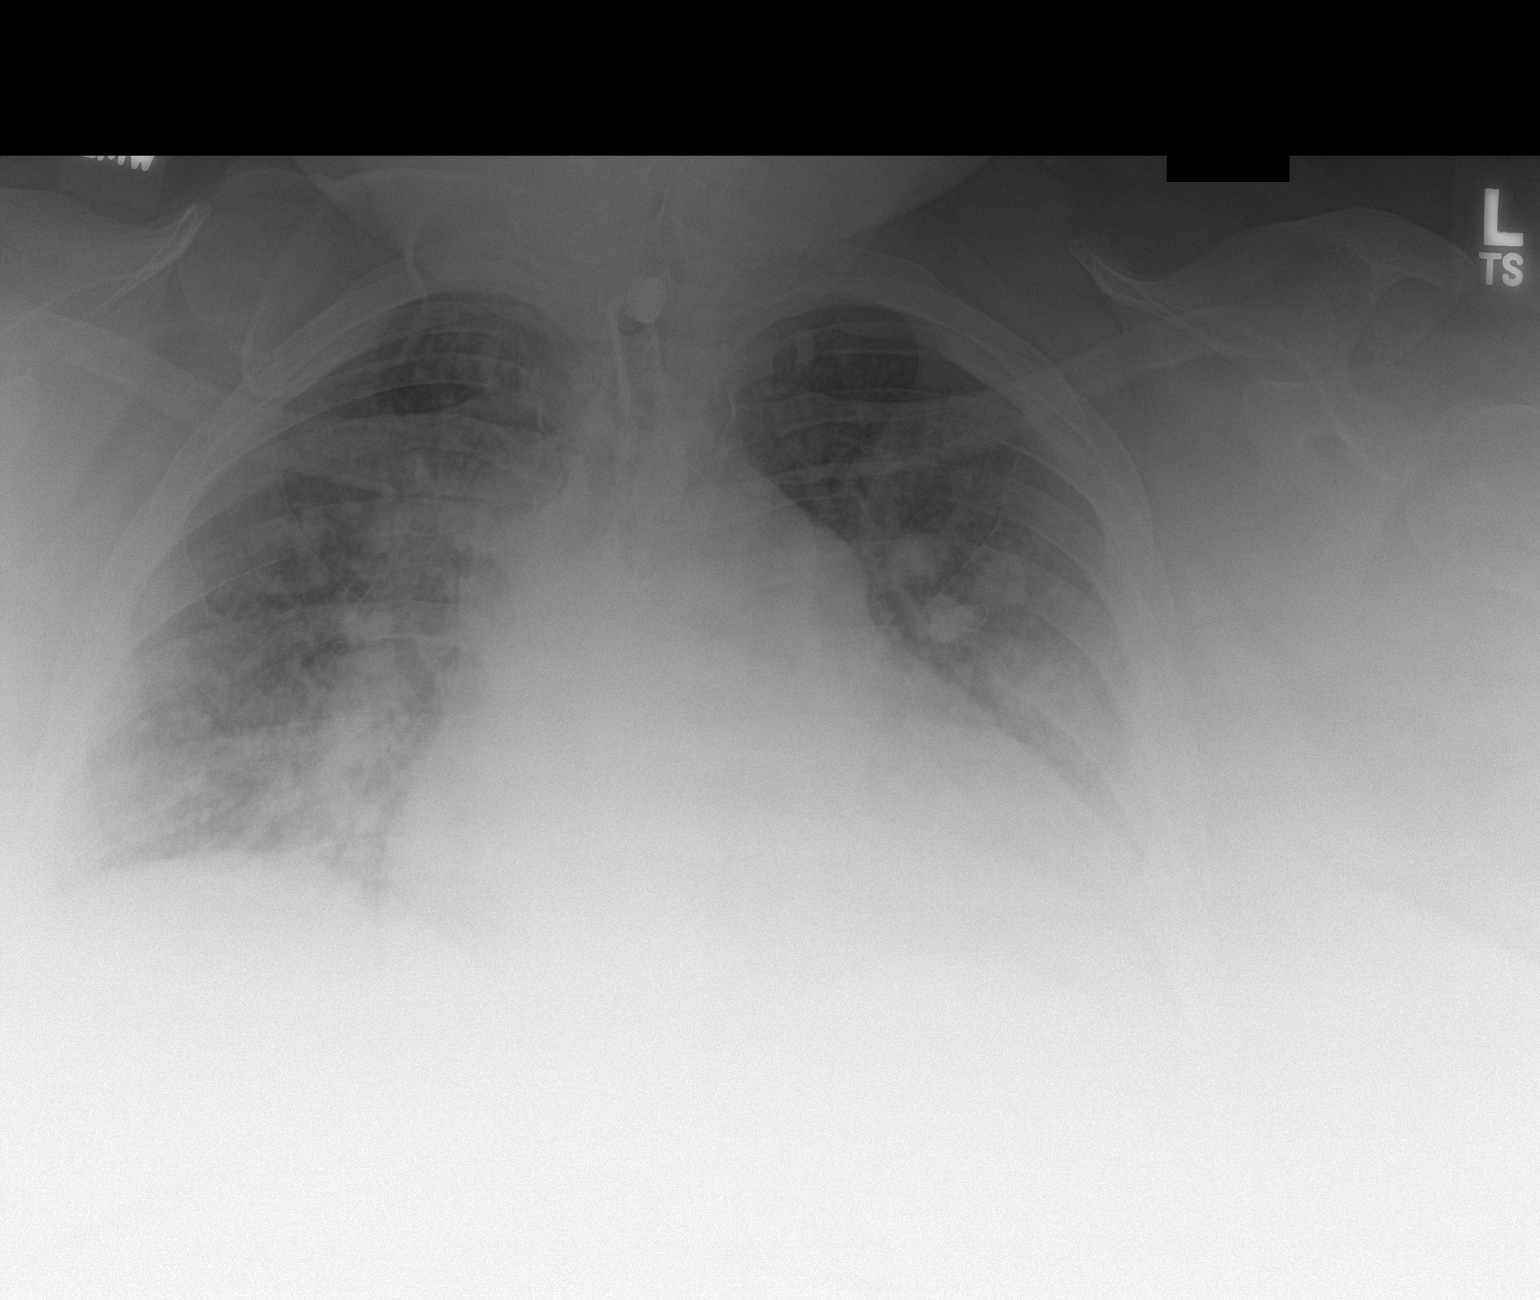
[im 2/2]
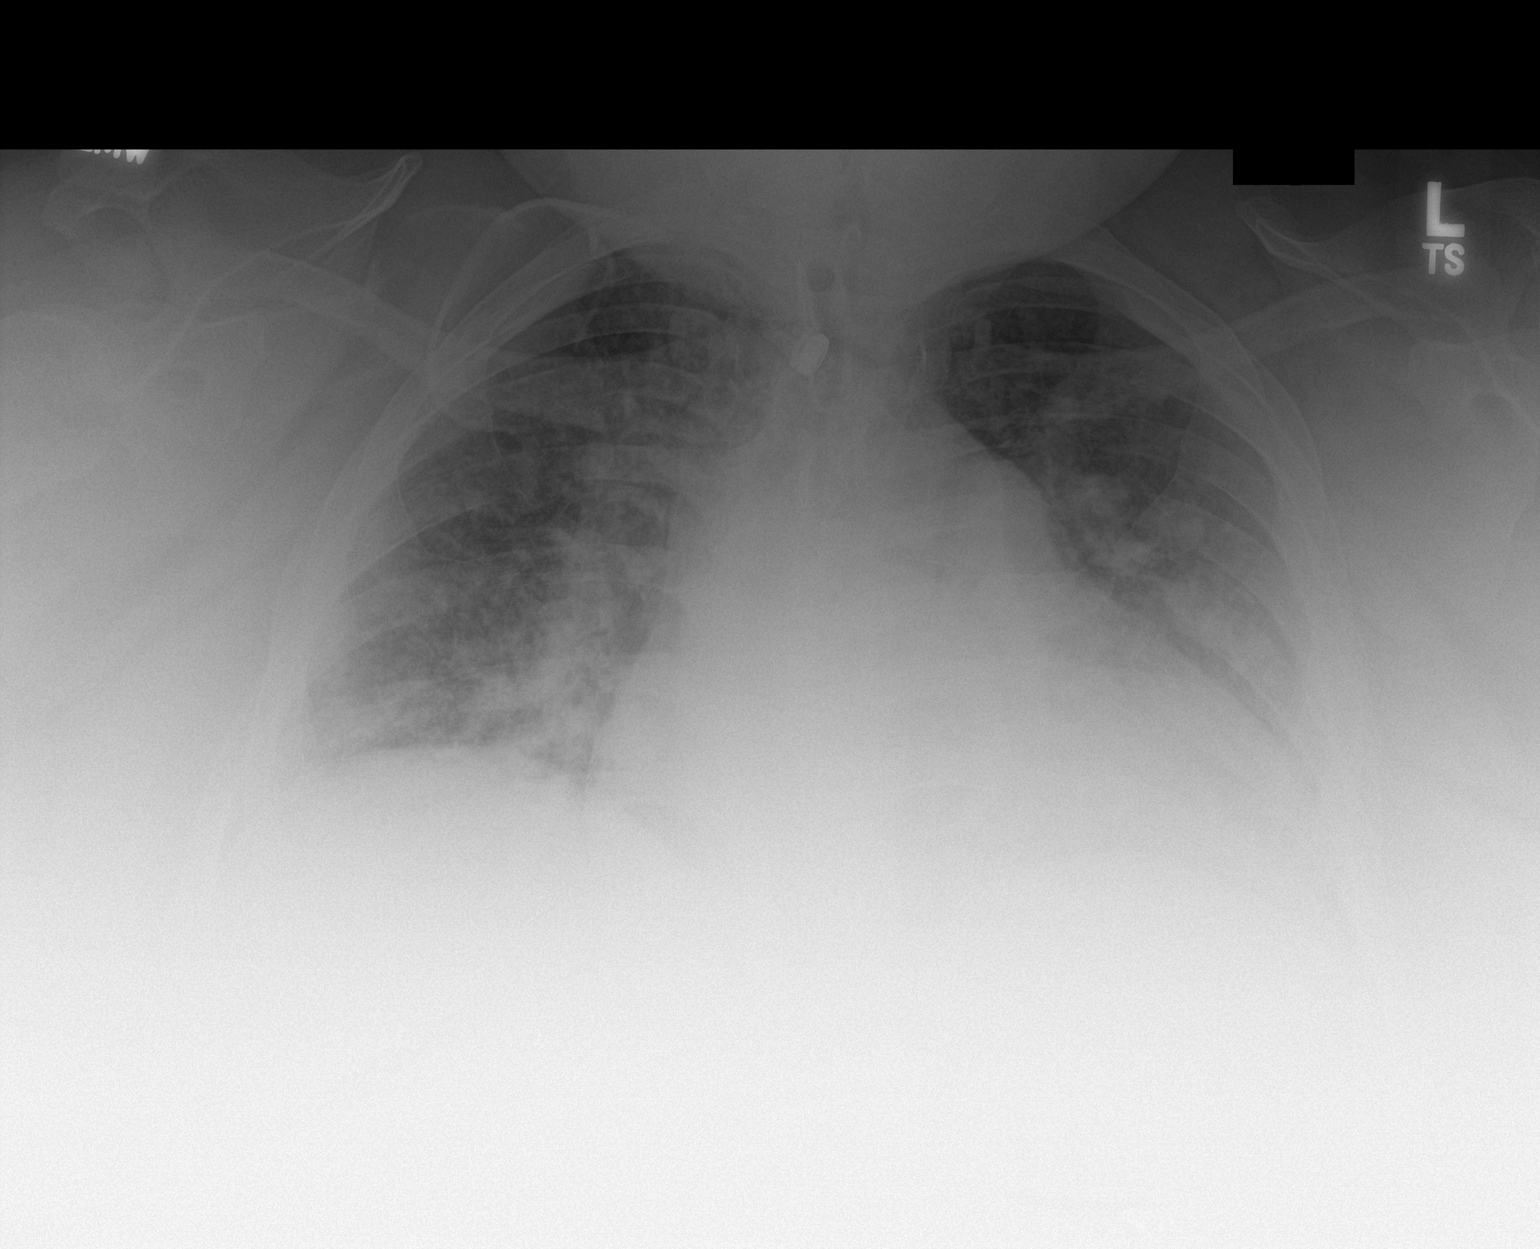

[2 of 2 positions shown; findings below may reference images not displayed]

FINDINGS: Shallow inspiration. Cardiac enlargement with prominent pulmonary
vascularity. Perihilar nodular changes likely represent prominent
vessels on end. No definite edema. No blunting of costophrenic
angles. No pneumothorax. Tracheostomy.
IMPRESSION: Cardiac enlargement with pulmonary vascular congestion. No definite
edema or consolidation.

## 2017-05-11 IMAGING — DX DG CHEST 1V PORT
2 series · 2 of 2 positions shown · non-contrast
Comparison: 08/13/2015

CLINICAL DATA: Acute respiratory failure.  Subsequent encounter.

EXAM:
PORTABLE CHEST 1 VIEW

[chest ap (1 of 2)]
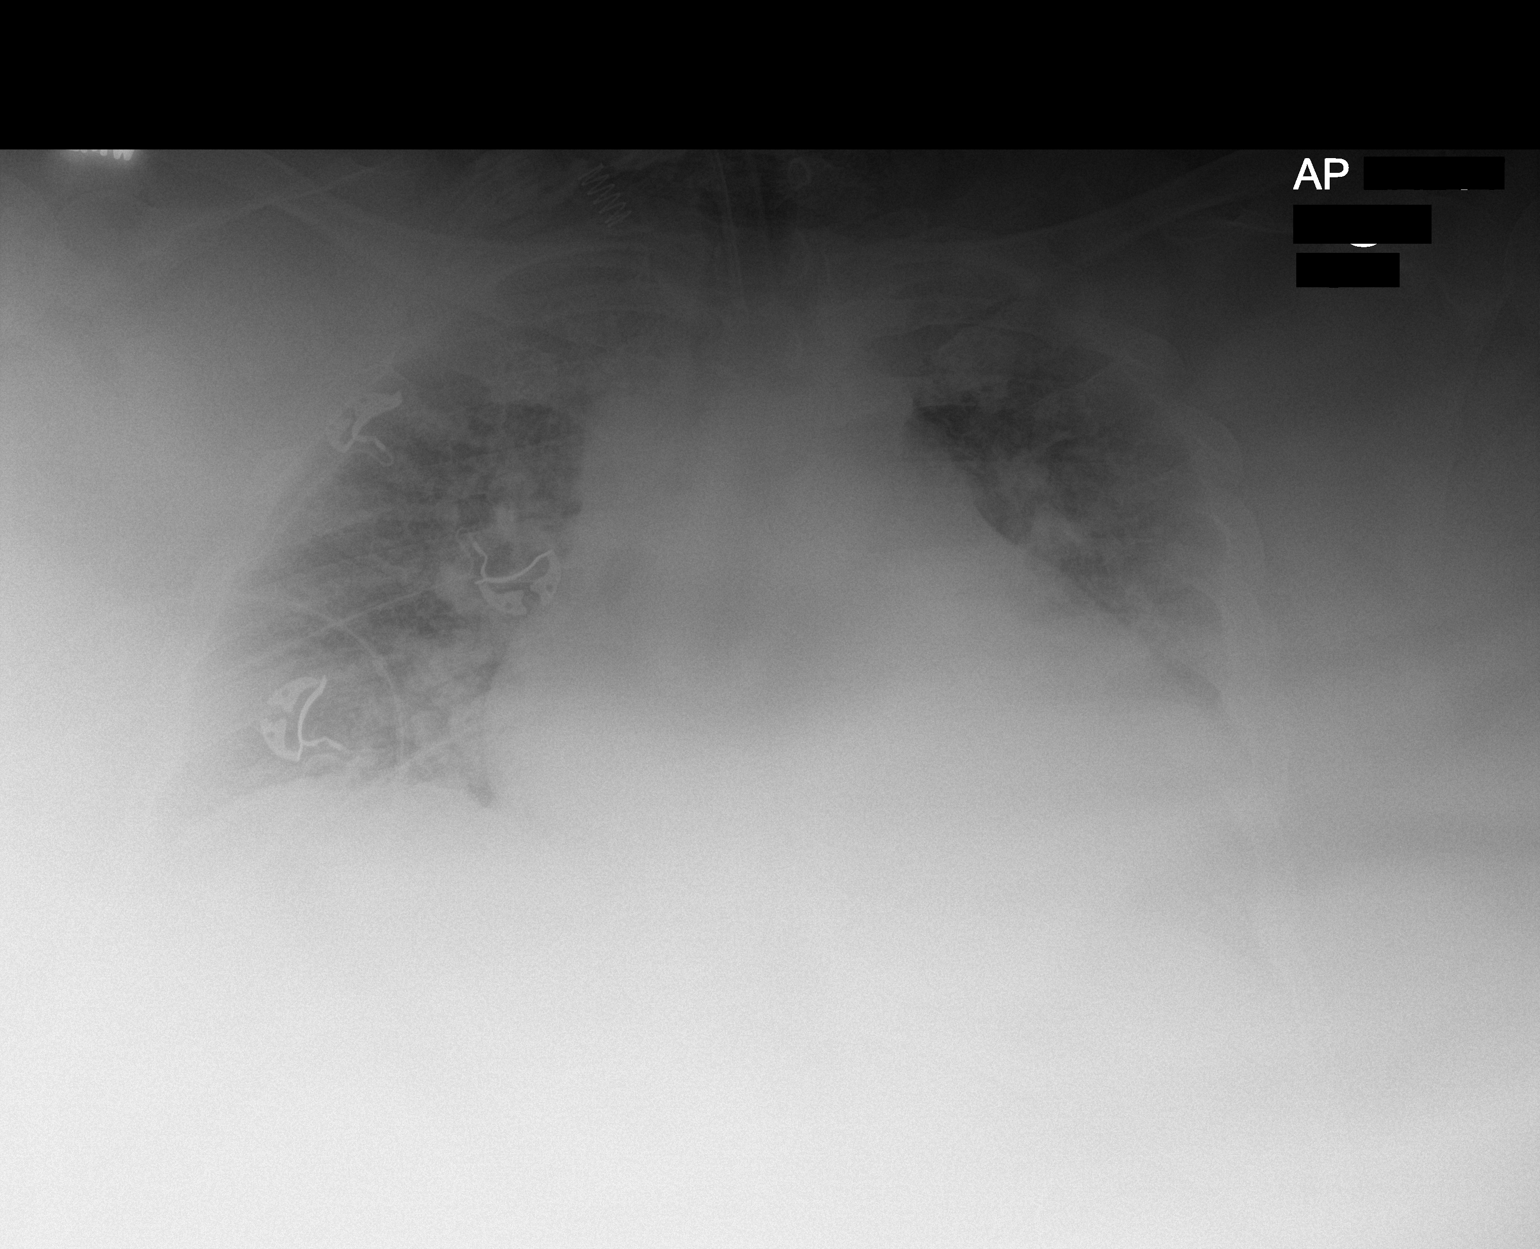

[chest ap (2 of 2)]
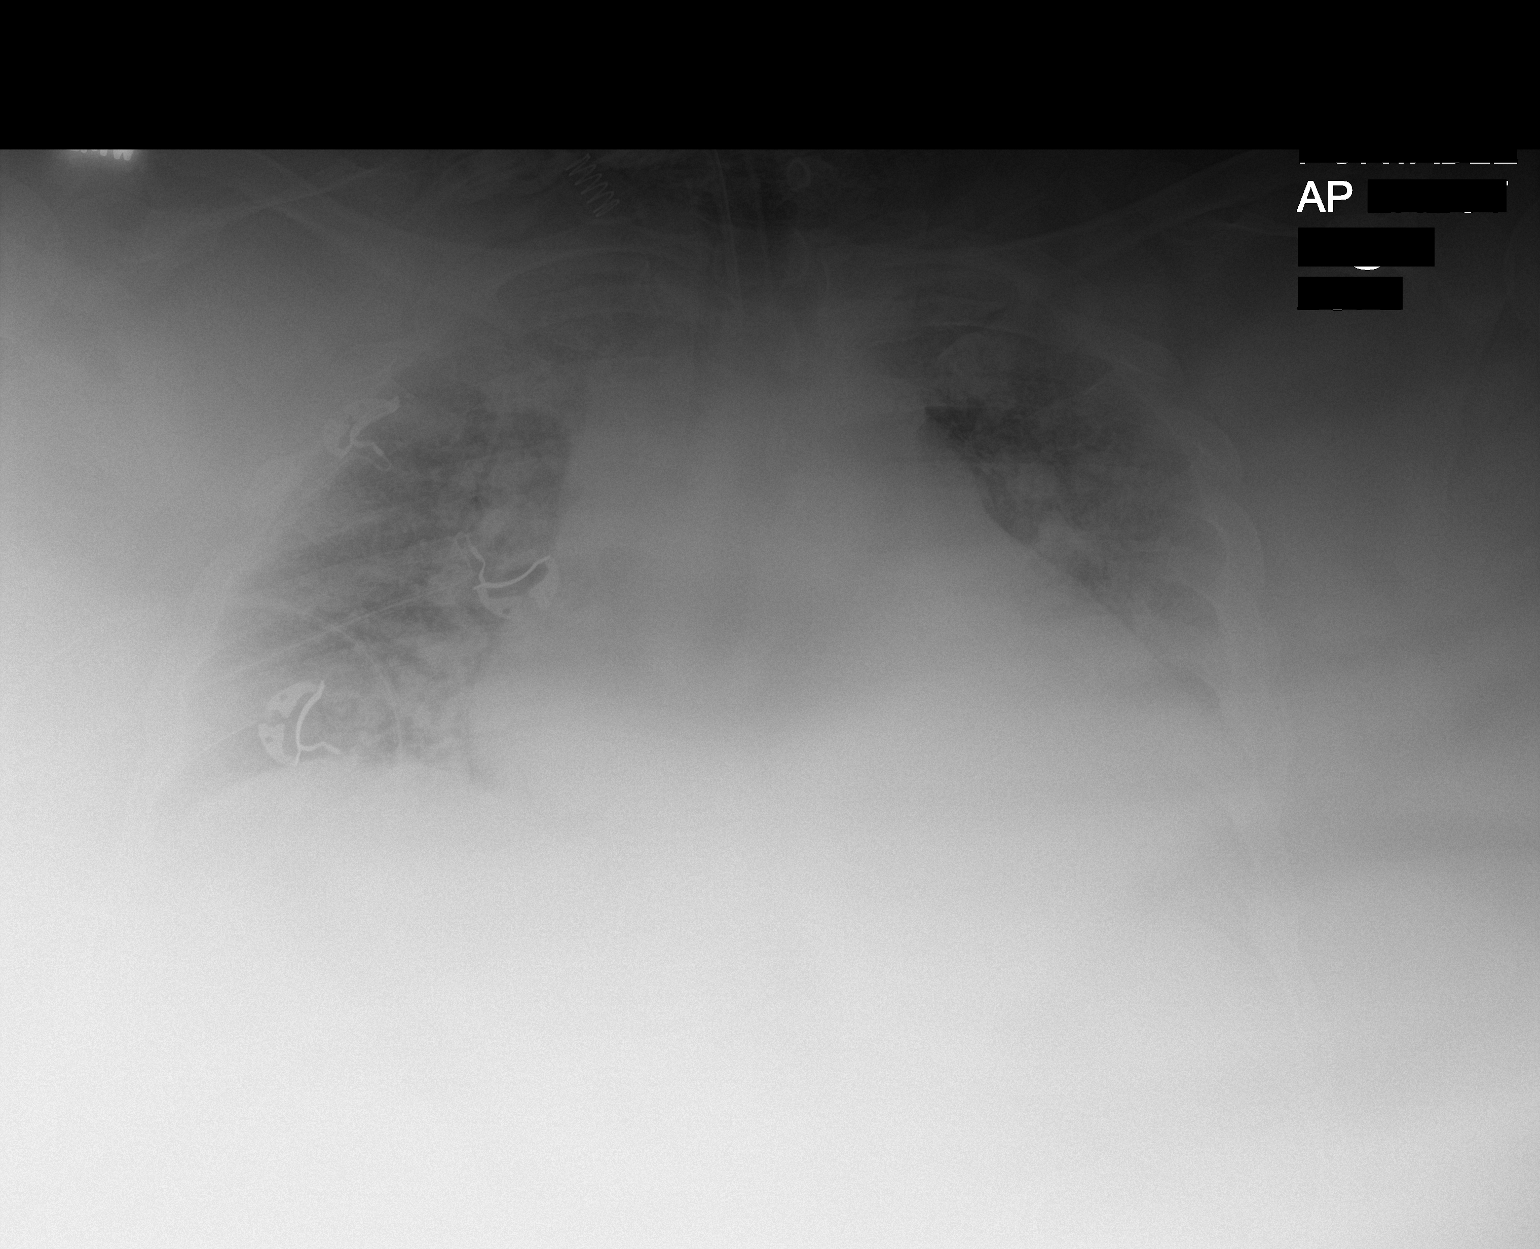

[2 of 2 positions shown; findings below may reference images not displayed]

FINDINGS: Mild enlargement of the cardiopericardial silhouette, stable.

Low lung volumes. This accentuates the appearance of central
vascular congestion and interstitial thickening. No convincing
pneumonia. No obvious pleural effusion or pneumothorax.

Tracheostomy tube is stable and well positioned.
IMPRESSION: 1. No significant change from the prior exam.
2. Possible mild pulmonary edema/congestive heart failure. Findings
are accentuated by the patient's body habitus and low lung volumes.
No evidence of pneumonia.

## 2017-12-25 DEATH — deceased
